# Patient Record
Sex: Female | Born: 1950 | Race: White | Hispanic: No | Marital: Married | Smoking: Never smoker
Health system: Southern US, Community
[De-identification: ages and names within clinical notes are randomized; demographics above are authoritative.]

## PROBLEM LIST (undated history)

## (undated) DIAGNOSIS — I341 Nonrheumatic mitral (valve) prolapse: Secondary | ICD-10-CM

## (undated) DIAGNOSIS — E785 Hyperlipidemia, unspecified: Secondary | ICD-10-CM

## (undated) DIAGNOSIS — C719 Malignant neoplasm of brain, unspecified: Secondary | ICD-10-CM

## (undated) HISTORY — PX: OTHER SURGICAL HISTORY: SHX169

## (undated) HISTORY — PX: TONSILLECTOMY: SUR1361

## (undated) HISTORY — DX: Hyperlipidemia, unspecified: E78.5

## (undated) HISTORY — DX: Nonrheumatic mitral (valve) prolapse: I34.1

---

## 1998-09-30 HISTORY — PX: TOTAL ABDOMINAL HYSTERECTOMY W/ BILATERAL SALPINGOOPHORECTOMY: SHX83

## 1998-09-30 HISTORY — PX: ABDOMINAL HYSTERECTOMY: SHX81

## 2005-01-03 ENCOUNTER — Ambulatory Visit: Payer: Self-pay | Admitting: Unknown Physician Specialty

## 2006-02-13 ENCOUNTER — Ambulatory Visit: Payer: Self-pay | Admitting: Unknown Physician Specialty

## 2007-04-23 ENCOUNTER — Ambulatory Visit: Payer: Self-pay | Admitting: Unknown Physician Specialty

## 2007-04-30 ENCOUNTER — Ambulatory Visit: Payer: Self-pay | Admitting: Unknown Physician Specialty

## 2007-11-09 ENCOUNTER — Ambulatory Visit: Payer: Self-pay | Admitting: Unknown Physician Specialty

## 2008-06-23 ENCOUNTER — Ambulatory Visit: Payer: Self-pay | Admitting: Unknown Physician Specialty

## 2008-07-04 ENCOUNTER — Ambulatory Visit: Payer: Self-pay | Admitting: Unknown Physician Specialty

## 2009-08-30 ENCOUNTER — Ambulatory Visit: Payer: Self-pay | Admitting: Unknown Physician Specialty

## 2010-09-19 ENCOUNTER — Ambulatory Visit: Payer: Self-pay | Admitting: Unknown Physician Specialty

## 2010-09-30 LAB — HM PAP SMEAR

## 2011-10-21 ENCOUNTER — Ambulatory Visit: Payer: Self-pay | Admitting: Unknown Physician Specialty

## 2012-01-02 ENCOUNTER — Encounter: Payer: Self-pay | Admitting: Internal Medicine

## 2012-01-02 ENCOUNTER — Ambulatory Visit (INDEPENDENT_AMBULATORY_CARE_PROVIDER_SITE_OTHER): Payer: BC Managed Care – PPO | Admitting: Internal Medicine

## 2012-01-02 VITALS — BP 120/68 | HR 93 | Temp 98.3°F | Wt 103.0 lb

## 2012-01-02 DIAGNOSIS — E785 Hyperlipidemia, unspecified: Secondary | ICD-10-CM | POA: Insufficient documentation

## 2012-01-02 DIAGNOSIS — J309 Allergic rhinitis, unspecified: Secondary | ICD-10-CM

## 2012-01-02 DIAGNOSIS — J302 Other seasonal allergic rhinitis: Secondary | ICD-10-CM | POA: Insufficient documentation

## 2012-01-02 DIAGNOSIS — Z23 Encounter for immunization: Secondary | ICD-10-CM | POA: Insufficient documentation

## 2012-01-02 DIAGNOSIS — N952 Postmenopausal atrophic vaginitis: Secondary | ICD-10-CM

## 2012-01-02 MED ORDER — CETIRIZINE HCL 10 MG PO TABS: 10.0000 mg | ORAL_TABLET | Freq: Every day | ORAL | Status: DC

## 2012-01-02 MED ORDER — ATORVASTATIN CALCIUM 20 MG PO TABS: 10.0000 mg | ORAL_TABLET | Freq: Every day | ORAL | Status: DC

## 2012-01-02 MED ORDER — ESTROGENS, CONJUGATED 0.625 MG/GM VA CREA: 0.5000 g | TOPICAL_CREAM | VAGINAL | Status: DC

## 2012-01-02 MED ORDER — ZOSTER VACCINE LIVE 19400 UNT/0.65ML ~~LOC~~ SOLR: 0.6500 mL | Freq: Once | SUBCUTANEOUS | Status: AC

## 2012-01-02 NOTE — Assessment & Plan Note (Signed)
Will check lipids and LFTs with labs today. Followup in 6 months for repeat LFTs.

## 2012-01-02 NOTE — Assessment & Plan Note (Signed)
Will continue Premarin. Samples given today.

## 2012-01-02 NOTE — Progress Notes (Signed)
Subjective:    Patient ID: Kelsey Woodard, female    DOB: Feb 11, 1951, 61 y.o.   MRN: [id number]  HPI  61 year old female with history of hyperlipidemia, atrophic vaginitis, and seasonal allergies presents to establish care. She reports that she is doing well. She denies any concerns today. In regards to her hyperlipidemia, she reports full compliance with her Lipitor. She denies any myalgia. In regards to her seasonal allergies, she reports full compliance with her Zyrtec. She reports improvement in nasal drainage and sneezing with this medication. In regards to her history of atrophic vaginitis, she reports improvement in vaginal moisture with minimal use of Premarin. She does not typically use the applicator but applies a small amount externally at night. She reports that she is up-to-date on her health maintenance with the exception of her shingles vaccine.  Outpatient Encounter Prescriptions as of 01/02/2012  Medication Sig Dispense Refill  . atorvastatin (LIPITOR) 20 MG tablet Take 0.5 tablets (10 mg total) by mouth daily.  90 tablet  3  . b complex vitamins tablet Take 1 tablet by mouth daily.      . Calcium Carb-Cholecalciferol 600-800 MG-UNIT TABS Take by mouth daily.      . cetirizine (ZYRTEC) 10 MG tablet Take 1 tablet (10 mg total) by mouth daily.  90 tablet  3  . conjugated estrogens (PREMARIN) vaginal cream Place 0.25 Applicatorfuls vaginally 2 (two) times a week. Or as directed  42.5 g  3  . zoster vaccine live, PF, (ZOSTAVAX) 40981 UNT/0.65ML injection Inject 19,400 Units into the skin once.  1 each  0    BP 120/68  Pulse 93  Temp(Src) 98.3 F (36.8 C) (Oral)  Wt 103 lb (46.72 kg)  SpO2 98%  Review of Systems  Constitutional: Negative for fever, chills, appetite change, fatigue and unexpected weight change.  HENT: Negative for ear pain, congestion, sore throat, trouble swallowing, neck pain, voice change and sinus pressure.   Eyes: Negative for visual disturbance.  Respiratory:  Negative for cough, shortness of breath, wheezing and stridor.   Cardiovascular: Negative for chest pain, palpitations and leg swelling.  Gastrointestinal: Negative for nausea, vomiting, abdominal pain, diarrhea, constipation, blood in stool, abdominal distention and anal bleeding.  Genitourinary: Negative for dysuria and flank pain.  Musculoskeletal: Negative for myalgias, arthralgias and gait problem.  Skin: Negative for color change and rash.  Neurological: Negative for dizziness and headaches.  Hematological: Negative for adenopathy. Does not bruise/bleed easily.  Psychiatric/Behavioral: Negative for suicidal ideas, sleep disturbance and dysphoric mood. The patient is not nervous/anxious.        Objective:   Physical Exam  Constitutional: She is oriented to person, place, and time. She appears well-developed and well-nourished. No distress.  HENT:  Head: Normocephalic and atraumatic.  Right Ear: External ear normal.  Left Ear: External ear normal.  Nose: Nose normal.  Mouth/Throat: Oropharynx is clear and moist. No oropharyngeal exudate.  Eyes: Conjunctivae are normal. Pupils are equal, round, and reactive to light. Right eye exhibits no discharge. Left eye exhibits no discharge. No scleral icterus.  Neck: Normal range of motion. Neck supple. No tracheal deviation present. No thyromegaly present.  Cardiovascular: Normal rate, regular rhythm, normal heart sounds and intact distal pulses.  Exam reveals no gallop and no friction rub.   No murmur heard. Pulmonary/Chest: Effort normal and breath sounds normal. No respiratory distress. She has no wheezes. She has no rales. She exhibits no tenderness.  Abdominal: Soft. Bowel sounds are normal. She exhibits no distension. There  is no tenderness.  Musculoskeletal: Normal range of motion. She exhibits no edema and no tenderness.  Lymphadenopathy:    She has no cervical adenopathy.  Neurological: She is alert and oriented to person, place, and  time. No cranial nerve deficit. She exhibits normal muscle tone. Coordination normal.  Skin: Skin is warm and dry. No rash noted. She is not diaphoretic. No erythema. No pallor.  Psychiatric: She has a normal mood and affect. Her behavior is normal. Judgment and thought content normal.          Assessment & Plan:

## 2012-01-02 NOTE — Assessment & Plan Note (Signed)
Will continue Zyrtec. Followup as needed.

## 2012-01-03 LAB — COMPREHENSIVE METABOLIC PANEL
AST: 28 U/L (ref 0–37)
Alkaline Phosphatase: 56 U/L (ref 39–117)
BUN: 20 mg/dL (ref 6–23)
Glucose, Bld: 97 mg/dL (ref 70–99)
Potassium: 4.4 mEq/L (ref 3.5–5.1)
Sodium: 142 mEq/L (ref 135–145)
Total Bilirubin: 0.3 mg/dL (ref 0.3–1.2)

## 2012-01-03 LAB — LIPID PANEL
Cholesterol: 170 mg/dL (ref 0–200)
LDL Cholesterol: 109 mg/dL — ABNORMAL HIGH (ref 0–99)
Triglycerides: 59 mg/dL (ref 0.0–149.0)

## 2012-06-24 ENCOUNTER — Other Ambulatory Visit: Payer: Self-pay | Admitting: *Deleted

## 2012-06-24 DIAGNOSIS — N952 Postmenopausal atrophic vaginitis: Secondary | ICD-10-CM

## 2012-06-25 MED ORDER — ESTROGENS, CONJUGATED 0.625 MG/GM VA CREA: 0.5000 g | TOPICAL_CREAM | VAGINAL | Status: DC

## 2012-06-25 NOTE — Telephone Encounter (Signed)
Rx for Premarin called to [facility]. Patient advised via message left on machine at home.

## 2012-07-02 ENCOUNTER — Telehealth: Payer: Self-pay | Admitting: Internal Medicine

## 2012-07-02 NOTE — Telephone Encounter (Signed)
Pt has a sinus infection and she is wondering if she could have some Amoxicillin called in ?? And a call back from a nurse.

## 2012-07-02 NOTE — Telephone Encounter (Signed)
Left message on machine at home for patient to return call. 

## 2012-07-07 NOTE — Telephone Encounter (Signed)
Spoke with patient via telephone she stated that she has been out of town but she took OTC Mucinex and she is feeling much better.

## 2012-10-12 ENCOUNTER — Telehealth: Payer: Self-pay | Admitting: Internal Medicine

## 2012-10-12 NOTE — Telephone Encounter (Signed)
Pt is wanting to know if she could switch from Dr. Dan Humphreys to Dr. Lorin Picket. She says some friends of her family see Dr. Lorin Picket she says it is nothing against Dr. Dan Humphreys at all.

## 2012-10-12 NOTE — Telephone Encounter (Signed)
My preference would be not to change md's among ourselves.  We can discuss this - as you had mentioned earlier.  Thanks.

## 2012-10-12 NOTE — Telephone Encounter (Signed)
Duplicate message.  See other message.

## 2012-10-12 NOTE — Telephone Encounter (Signed)
Charlene, This is fine with me. We have generally not allowed changing between providers, and probably need to make a firm office policy on this. However, recently, there have been some cases of patient changing for various reasons.  I am ambivalent.

## 2012-10-15 ENCOUNTER — Telehealth: Payer: Self-pay | Admitting: Internal Medicine

## 2012-10-15 NOTE — Telephone Encounter (Signed)
Spoke with patient via telephone and advised her to call her pharmacy to see which Rxs would fall in the cheaper tier and let us know.  She stated that she will call me back tomorrow.

## 2012-10-15 NOTE — Telephone Encounter (Signed)
The patient has a new insurance plan for her medication and her birth control pill falls into tier 4 not tier 1. She is wanting a birth control pill that is similar to what she is taking and will fall in the tier 1 category.

## 2012-10-26 NOTE — Telephone Encounter (Addendum)
Spoke to patient and was advised that she did not call about her insurance plan or birth control pills. Patient states that she called about switching her appointment from Dr. Dan Humphreys to Dr. Lorin Picket in April.

## 2012-10-27 NOTE — Telephone Encounter (Signed)
Please check on this and let patient know.

## 2012-10-29 ENCOUNTER — Telehealth: Payer: Self-pay | Admitting: Internal Medicine

## 2012-10-29 NOTE — Telephone Encounter (Signed)
See phone note from 10/12/12.  Pt called back stating no one called to her to let her know if she could switch from dr walker to dr Lorin Picket. I explained to her that one of the dr did not want to switch between the dr in practice Pt insisted that i ask again.  She stated her daughter (didn't give daughter name) was a pt of dr walker and when dr walker was on vacation she called to get an appointment pt daughter has shingles on face and was told we didn't have any opening with any provider and was told to go to urgent care. She thought that was unprofessioinal.  She wanted to know if she canceled her appointment with dr walker and then called back to make appointment with dr Lorin Picket.  I explained to her that we would try to make her appointment with her primary dr first that would be dr walker and if dr walker didn't have an opening we would try to get her in with another provider

## 2012-10-29 NOTE — Telephone Encounter (Signed)
Pt called back to see if she could switch

## 2012-10-29 NOTE — Telephone Encounter (Signed)
To my knowledge I have not seen this pt.

## 2012-10-29 NOTE — Telephone Encounter (Signed)
Duplicate message.  To my knowledge I have not seen this pt.

## 2012-10-29 NOTE — Telephone Encounter (Signed)
To my knowledge I have not seen this pt.   

## 2012-10-29 NOTE — Telephone Encounter (Signed)
This pt was seen by Dr. Lin Givens in the past at [facility], [place]. See notes in chart.

## 2012-10-29 NOTE — Telephone Encounter (Signed)
Dr. Lorin Picket, did you previously see this patient?  She would like to know if she can see you in April vs. Dr. Dan Humphreys, she has only seen Dr. Dan Humphreys once as a new patient. Please advise so I can advise patient.  If you haven't seen patient in the past then I will let her know that she needs to stay with Dr. Dan Humphreys since we prefer patients to stay with one provider.

## 2012-10-29 NOTE — Telephone Encounter (Signed)
Sounds like she is unhappy at this office. In general, I think our policy has been not to change in between providers.

## 2012-11-04 ENCOUNTER — Encounter: Payer: Self-pay | Admitting: Internal Medicine

## 2012-11-10 ENCOUNTER — Telehealth: Payer: Self-pay | Admitting: Internal Medicine

## 2012-11-10 NOTE — Telephone Encounter (Signed)
Patient dismissed from [facility] by Jennifer Walker, MD, effective 11/04/2012. Dismissal letter sent out by certified / registered mail. rmf °

## 2012-11-10 NOTE — Telephone Encounter (Signed)
Patient called wanting to switch from Dr. Dan Humphreys to Dr. Lorin Picket . Her chart is showing that she has been discharged from this practice . I did ask if she had received any communication from this office ,not stating that she was discharged . She informed me she had not received any letters or calls from our office . I called Carollee Herter to see what we could inform the patient, and her reply was we can tell her that the doctors did make the choice that Dr. Tilman Neat patient's will stay with Dr. Dan Humphreys and Dr. Roby Lofts patient's will stay with Dr. Lorin Picket. Patient asked was this a policy. I informed her that it was not a policy but a choice made by the doctors. She stated she would go to someone higher up to ask them about our policies. I did offer her the supervisors line to leave a message and she would return her call.

## 2012-12-21 ENCOUNTER — Other Ambulatory Visit: Payer: Self-pay | Admitting: Internal Medicine

## 2013-01-07 ENCOUNTER — Encounter: Payer: BC Managed Care – PPO | Admitting: Internal Medicine

## 2013-01-11 ENCOUNTER — Encounter: Payer: BC Managed Care – PPO | Admitting: Internal Medicine

## 2013-03-23 ENCOUNTER — Ambulatory Visit: Payer: Self-pay | Admitting: Internal Medicine

## 2013-08-05 ENCOUNTER — Other Ambulatory Visit: Payer: Self-pay

## 2014-05-02 ENCOUNTER — Ambulatory Visit: Payer: Self-pay | Admitting: Internal Medicine

## 2015-03-21 ENCOUNTER — Other Ambulatory Visit: Payer: Self-pay | Admitting: Internal Medicine

## 2015-03-21 DIAGNOSIS — Z1231 Encounter for screening mammogram for malignant neoplasm of breast: Secondary | ICD-10-CM

## 2015-05-04 ENCOUNTER — Ambulatory Visit: Payer: Self-pay

## 2015-06-07 ENCOUNTER — Ambulatory Visit
Admission: RE | Admit: 2015-06-07 | Discharge: 2015-06-07 | Disposition: A | Discharge status: Home / Self Care | Payer: PPO | Source: Ambulatory Visit | Attending: Internal Medicine | Admitting: Internal Medicine

## 2015-06-07 DIAGNOSIS — Z1231 Encounter for screening mammogram for malignant neoplasm of breast: Secondary | ICD-10-CM | POA: Diagnosis present

## 2016-03-18 DIAGNOSIS — R7309 Other abnormal glucose: Secondary | ICD-10-CM | POA: Diagnosis not present

## 2016-03-18 DIAGNOSIS — E782 Mixed hyperlipidemia: Secondary | ICD-10-CM | POA: Diagnosis not present

## 2016-03-18 DIAGNOSIS — Z79899 Other long term (current) drug therapy: Secondary | ICD-10-CM | POA: Diagnosis not present

## 2016-03-18 DIAGNOSIS — E7800 Pure hypercholesterolemia, unspecified: Secondary | ICD-10-CM | POA: Diagnosis not present

## 2016-03-18 DIAGNOSIS — M810 Age-related osteoporosis without current pathological fracture: Secondary | ICD-10-CM | POA: Diagnosis not present

## 2016-03-18 DIAGNOSIS — Z0000 Encounter for general adult medical examination without abnormal findings: Secondary | ICD-10-CM | POA: Diagnosis not present

## 2016-03-18 DIAGNOSIS — J301 Allergic rhinitis due to pollen: Secondary | ICD-10-CM | POA: Diagnosis not present

## 2016-03-18 DIAGNOSIS — Z1211 Encounter for screening for malignant neoplasm of colon: Secondary | ICD-10-CM | POA: Diagnosis not present

## 2016-03-18 DIAGNOSIS — Z23 Encounter for immunization: Secondary | ICD-10-CM | POA: Diagnosis not present

## 2016-03-18 DIAGNOSIS — Z1239 Encounter for other screening for malignant neoplasm of breast: Secondary | ICD-10-CM | POA: Diagnosis not present

## 2016-03-27 DIAGNOSIS — Z1211 Encounter for screening for malignant neoplasm of colon: Secondary | ICD-10-CM | POA: Diagnosis not present

## 2016-04-16 ENCOUNTER — Other Ambulatory Visit: Payer: Self-pay | Admitting: Internal Medicine

## 2016-04-16 DIAGNOSIS — Z1231 Encounter for screening mammogram for malignant neoplasm of breast: Secondary | ICD-10-CM

## 2016-06-10 ENCOUNTER — Ambulatory Visit: Payer: PPO

## 2016-06-26 ENCOUNTER — Other Ambulatory Visit: Payer: Self-pay | Admitting: Internal Medicine

## 2016-06-26 ENCOUNTER — Ambulatory Visit
Admission: RE | Admit: 2016-06-26 | Discharge: 2016-06-26 | Disposition: A | Discharge status: Home / Self Care | Payer: PPO | Source: Ambulatory Visit | Attending: Internal Medicine | Admitting: Internal Medicine

## 2016-06-26 DIAGNOSIS — R928 Other abnormal and inconclusive findings on diagnostic imaging of breast: Secondary | ICD-10-CM | POA: Insufficient documentation

## 2016-06-26 DIAGNOSIS — Z1231 Encounter for screening mammogram for malignant neoplasm of breast: Secondary | ICD-10-CM

## 2016-06-27 ENCOUNTER — Other Ambulatory Visit: Payer: Self-pay | Admitting: Internal Medicine

## 2016-06-27 DIAGNOSIS — N6310 Unspecified lump in the right breast, unspecified quadrant: Secondary | ICD-10-CM

## 2016-07-16 ENCOUNTER — Ambulatory Visit
Admission: RE | Admit: 2016-07-16 | Discharge: 2016-07-16 | Disposition: A | Discharge status: Home / Self Care | Payer: PPO | Source: Ambulatory Visit | Attending: Internal Medicine | Admitting: Internal Medicine

## 2016-07-16 DIAGNOSIS — R928 Other abnormal and inconclusive findings on diagnostic imaging of breast: Secondary | ICD-10-CM | POA: Diagnosis not present

## 2016-07-16 DIAGNOSIS — N6310 Unspecified lump in the right breast, unspecified quadrant: Secondary | ICD-10-CM

## 2016-09-17 DIAGNOSIS — H524 Presbyopia: Secondary | ICD-10-CM | POA: Diagnosis not present

## 2016-09-17 DIAGNOSIS — D3131 Benign neoplasm of right choroid: Secondary | ICD-10-CM | POA: Diagnosis not present

## 2016-12-01 ENCOUNTER — Encounter: Payer: Self-pay | Admitting: Emergency Medicine

## 2016-12-01 ENCOUNTER — Emergency Department
Admission: EM | Admit: 2016-12-01 | Discharge: 2016-12-01 | Payer: PPO | Attending: Student in an Organized Health Care Education/Training Program | Admitting: Student in an Organized Health Care Education/Training Program

## 2016-12-01 ENCOUNTER — Emergency Department: Payer: PPO

## 2016-12-01 DIAGNOSIS — Z5181 Encounter for therapeutic drug level monitoring: Secondary | ICD-10-CM | POA: Diagnosis not present

## 2016-12-01 DIAGNOSIS — Z79899 Other long term (current) drug therapy: Secondary | ICD-10-CM | POA: Insufficient documentation

## 2016-12-01 DIAGNOSIS — G9389 Other specified disorders of brain: Secondary | ICD-10-CM

## 2016-12-01 DIAGNOSIS — G939 Disorder of brain, unspecified: Secondary | ICD-10-CM | POA: Diagnosis not present

## 2016-12-01 DIAGNOSIS — Z90710 Acquired absence of both cervix and uterus: Secondary | ICD-10-CM | POA: Diagnosis not present

## 2016-12-01 DIAGNOSIS — M810 Age-related osteoporosis without current pathological fracture: Secondary | ICD-10-CM | POA: Diagnosis not present

## 2016-12-01 DIAGNOSIS — R519 Headache, unspecified: Secondary | ICD-10-CM

## 2016-12-01 DIAGNOSIS — Z794 Long term (current) use of insulin: Secondary | ICD-10-CM | POA: Diagnosis not present

## 2016-12-01 DIAGNOSIS — N809 Endometriosis, unspecified: Secondary | ICD-10-CM | POA: Diagnosis not present

## 2016-12-01 DIAGNOSIS — R4182 Altered mental status, unspecified: Secondary | ICD-10-CM | POA: Diagnosis not present

## 2016-12-01 DIAGNOSIS — C711 Malignant neoplasm of frontal lobe: Secondary | ICD-10-CM | POA: Diagnosis not present

## 2016-12-01 DIAGNOSIS — E785 Hyperlipidemia, unspecified: Secondary | ICD-10-CM | POA: Diagnosis not present

## 2016-12-01 DIAGNOSIS — R9089 Other abnormal findings on diagnostic imaging of central nervous system: Secondary | ICD-10-CM

## 2016-12-01 DIAGNOSIS — R51 Headache: Secondary | ICD-10-CM | POA: Diagnosis not present

## 2016-12-01 DIAGNOSIS — Z8052 Family history of malignant neoplasm of bladder: Secondary | ICD-10-CM | POA: Diagnosis not present

## 2016-12-01 DIAGNOSIS — Z9989 Dependence on other enabling machines and devices: Secondary | ICD-10-CM | POA: Diagnosis not present

## 2016-12-01 DIAGNOSIS — D649 Anemia, unspecified: Secondary | ICD-10-CM | POA: Diagnosis not present

## 2016-12-01 DIAGNOSIS — R930 Abnormal findings on diagnostic imaging of skull and head, not elsewhere classified: Secondary | ICD-10-CM | POA: Diagnosis not present

## 2016-12-01 DIAGNOSIS — R400 Somnolence: Secondary | ICD-10-CM | POA: Diagnosis not present

## 2016-12-01 LAB — CBC
HCT: 38.4 % (ref 35.0–47.0)
Hemoglobin: 13 g/dL (ref 12.0–16.0)
MCH: 28.6 pg (ref 26.0–34.0)
MCHC: 34 g/dL (ref 32.0–36.0)
MCV: 84.2 fL (ref 80.0–100.0)
Platelets: 229 10*3/uL (ref 150–440)
RBC: 4.56 MIL/uL (ref 3.80–5.20)
RDW: 13.8 % (ref 11.5–14.5)
WBC: 8.3 10*3/uL (ref 3.6–11.0)

## 2016-12-01 LAB — COMPREHENSIVE METABOLIC PANEL
ALT: 19 U/L (ref 14–54)
ANION GAP: 9 (ref 5–15)
AST: 23 U/L (ref 15–41)
Albumin: 4.2 g/dL (ref 3.5–5.0)
Alkaline Phosphatase: 65 U/L (ref 38–126)
BUN: 25 mg/dL — ABNORMAL HIGH (ref 6–20)
CHLORIDE: 110 mmol/L (ref 101–111)
CO2: 25 mmol/L (ref 22–32)
Calcium: 9 mg/dL (ref 8.9–10.3)
Creatinine, Ser: 0.87 mg/dL (ref 0.44–1.00)
Glucose, Bld: 100 mg/dL — ABNORMAL HIGH (ref 65–99)
Potassium: 3.9 mmol/L (ref 3.5–5.1)
SODIUM: 144 mmol/L (ref 135–145)
Total Bilirubin: 0.7 mg/dL (ref 0.3–1.2)
Total Protein: 7.8 g/dL (ref 6.5–8.1)

## 2016-12-01 LAB — GLUCOSE, CAPILLARY: Glucose-Capillary: 87 mg/dL (ref 65–99)

## 2016-12-01 MED ORDER — SODIUM CHLORIDE 0.9 % IV SOLN
1000.0000 mg | Freq: Once | INTRAVENOUS | Status: AC
  Administered 2016-12-01: 1000 mg via INTRAVENOUS
  Filled 2016-12-01: qty 10

## 2016-12-01 MED ORDER — DEXAMETHASONE SODIUM PHOSPHATE 10 MG/ML IJ SOLN
10.0000 mg | Freq: Once | INTRAMUSCULAR | Status: AC
  Administered 2016-12-01: 10 mg via INTRAVENOUS
  Filled 2016-12-01: qty 1

## 2016-12-01 MED ORDER — DIPHENHYDRAMINE HCL 50 MG/ML IJ SOLN
25.0000 mg | Freq: Once | INTRAMUSCULAR | Status: AC
  Administered 2016-12-01: 25 mg via INTRAVENOUS
  Filled 2016-12-01: qty 1

## 2016-12-01 MED ORDER — PROCHLORPERAZINE EDISYLATE 5 MG/ML IJ SOLN
10.0000 mg | Freq: Once | INTRAMUSCULAR | Status: AC
  Administered 2016-12-01: 10 mg via INTRAVENOUS
  Filled 2016-12-01: qty 2

## 2016-12-01 MED ORDER — ACETAMINOPHEN 500 MG PO TABS
1000.0000 mg | ORAL_TABLET | Freq: Once | ORAL | Status: AC
  Administered 2016-12-01: 1000 mg via ORAL
  Filled 2016-12-01: qty 2

## 2016-12-01 MED ORDER — IOPAMIDOL (ISOVUE-370) INJECTION 76%
75.0000 mL | Freq: Once | INTRAVENOUS | Status: AC | PRN
  Administered 2016-12-01: 75 mL via INTRAVENOUS

## 2016-12-01 NOTE — ED Notes (Signed)
MD Robinson at bedside 

## 2016-12-01 NOTE — ED Triage Notes (Signed)
Headaches off and on and periods of confusion since Friday.  Unable to comprehend simple things per husband. Pt has been taking care of aunt's finances and while doing checkbook but was not able to grasp idea of what she was doing. Pt is alert and oriented to person, place, time and situation.

## 2016-12-01 NOTE — ED Provider Notes (Signed)
[facility] Emergency Department Provider Note    First MD Initiated Contact with Patient 12/01/16 [phone]     (approximate)  I have reviewed the triage vital signs and the nursing notes.   HISTORY  Chief Complaint Headache and Altered Mental Status    HPI Kelsey Woodard is a 66 y.o. female who presents with left-sided headache that is been on and off since Friday.  According to husband patient's been more confused than usual. The patient denies any history of headaches. States that the headache is been left posterior neck.  Denies any numbness or tingling. No unsteady gait. No trauma. No fevers. No visual disturbance.  According to patient and husband she's having more difficulty performing simple tasks. The primary concern is this confusion which is new.   Past Medical History:  Diagnosis Date  . Endometriosis   . Hyperlipidemia   . Mitral valve prolapse    Family History  Problem Relation Age of Onset  . Cancer Father     Bladder - 28's  . Hyperlipidemia Father   . Heart disease Father   . Cancer Maternal Aunt     Breast - 70's  . Breast cancer Maternal Aunt 82   Past Surgical History:  Procedure Laterality Date  . ABDOMINAL HYSTERECTOMY  2000   Dr. Rayford Halsted  . CESAREAN SECTION     1  . TONSILLECTOMY    . TOTAL ABDOMINAL HYSTERECTOMY W/ BILATERAL SALPINGOOPHORECTOMY  2000   Patient Active Problem List   Diagnosis Date Noted  . Hyperlipidemia LDL goal < 100 01/02/2012  . Seasonal allergies 01/02/2012  . Atrophic vaginitis 01/02/2012  . Vaccine for VZV (varicella-zoster virus) 01/02/2012      Prior to Admission medications   Medication Sig Start Date End Date Taking? Authorizing Provider  atorvastatin (LIPITOR) 20 MG tablet Take 0.5 tablets (10 mg total) by mouth daily. 01/02/12   Jackolyn Confer, MD  b complex vitamins tablet Take 1 tablet by mouth daily.    Historical Provider, MD  Calcium Carb-Cholecalciferol 600-800 MG-UNIT TABS Take  by mouth daily.    Historical Provider, MD  cetirizine (ZYRTEC) 10 MG tablet TAKE 1 TABLET (10 MG TOTAL) BY MOUTH DAILY. 12/21/12   Jackolyn Confer, MD  conjugated estrogens (PREMARIN) vaginal cream Place [id number] Applicatorfuls vaginally 2 (two) times a week. Or as directed 06/24/12   Jackolyn Confer, MD    Allergies Patient has no known allergies.    Social History Social History  Substance Use Topics  . Smoking status: Never Smoker  . Smokeless tobacco: Never Used  . Alcohol use No    Review of Systems Patient denies headaches, rhinorrhea, blurry vision, numbness, shortness of breath, chest pain, edema, cough, abdominal pain, nausea, vomiting, diarrhea, dysuria, fevers, rashes or hallucinations unless otherwise stated above in HPI. ____________________________________________   PHYSICAL EXAM:  VITAL SIGNS: Vitals:   12/01/16 0909  Temp: 97.8 F (36.6 C)    Constitutional: Alert and oriented. Well appearing and in no acute distress. Eyes: Conjunctivae are normal. PERRL. EOMI. Head: Atraumatic. Nose: No congestion/rhinnorhea. Mouth/Throat: Mucous membranes are moist.  Oropharynx non-erythematous. Neck: No stridor. Painless ROM. No cervical spine tenderness to palpation Hematological/Lymphatic/Immunilogical: No cervical lymphadenopathy. Cardiovascular: Normal rate, regular rhythm. Grossly normal heart sounds.  Good peripheral circulation. Respiratory: Normal respiratory effort.  No retractions. Lungs CTAB. Gastrointestinal: Soft and nontender. No distention. No abdominal bruits. No CVA tenderness. Musculoskeletal: No lower extremity tenderness nor edema.  No joint effusions. Neurologic:  CN- intact.  No facial droop, Normal FNF.  Normal heel to shin.  Sensation intact bilaterally. Normal speech and language. No gross focal neurologic deficits are appreciated. No gait instability.  Skin:  Skin is warm, dry and intact. No rash noted. Psychiatric: Mood and affect are normal.  Speech and behavior are normal.  ____________________________________________   LABS (all labs ordered are listed, but only abnormal results are displayed)  Results for orders placed or performed during the hospital encounter of 12/01/16 (from the past 24 hour(s))  Comprehensive metabolic panel     Status: Abnormal   Collection Time: 12/01/16  9:19 AM  Result Value Ref Range   Sodium 144 135 - 145 mmol/L   Potassium 3.9 3.5 - 5.1 mmol/L   Chloride 110 101 - 111 mmol/L   CO2 25 22 - 32 mmol/L   Glucose, Bld 100 (H) 65 - 99 mg/dL   BUN 25 (H) 6 - 20 mg/dL   Creatinine, Ser 0.87 0.44 - 1.00 mg/dL   Calcium 9.0 8.9 - 10.3 mg/dL   Total Protein 7.8 6.5 - 8.1 g/dL   Albumin 4.2 3.5 - 5.0 g/dL   AST 23 15 - 41 U/L   ALT 19 14 - 54 U/L   Alkaline Phosphatase 65 38 - 126 U/L   Total Bilirubin 0.7 0.3 - 1.2 mg/dL   GFR calc non Af Amer >60 >60 mL/min   GFR calc Af Amer >60 >60 mL/min   Anion gap 9 5 - 15  CBC     Status: None   Collection Time: 12/01/16  9:19 AM  Result Value Ref Range   WBC 8.3 3.6 - 11.0 K/uL   RBC 4.56 3.80 - 5.20 MIL/uL   Hemoglobin 13.0 12.0 - 16.0 g/dL   HCT 38.4 35.0 - 47.0 %   MCV 84.2 80.0 - 100.0 fL   MCH 28.6 26.0 - 34.0 pg   MCHC 34.0 32.0 - 36.0 g/dL   RDW 13.8 11.5 - 14.5 %   Platelets 229 150 - 440 K/uL  Glucose, capillary     Status: None   Collection Time: 12/01/16  9:43 AM  Result Value Ref Range   Glucose-Capillary 87 65 - 99 mg/dL   ____________________________________________  EKG My review and personal interpretation at Time: 9:14   Indication: confusion  Rate: 75  Rhythm: sinus Axis: normal Other: normal intervals, noSTEMI ____________________________________________  RADIOLOGY  I personally reviewed all radiographic images ordered to evaluate for the above acute complaints and reviewed radiology reports and findings.  These findings were personally discussed with the patient.  Please see medical record for radiology  report. _______________________________________   PROCEDURES  Procedure(s) performed:  Procedures    Critical Care performed: yes CRITICAL CARE Performed by: Merlyn Lot   Total critical care time: 40 minutes  Critical care time was exclusive of separately billable procedures and treating other patients.  Critical care was necessary to treat or prevent imminent or life-threatening deterioration.  Critical care was time spent personally by me on the following activities: development of treatment plan with patient and/or surrogate as well as nursing, discussions with consultants, evaluation of patient's response to treatment, examination of patient, obtaining history from patient or surrogate, ordering and performing treatments and interventions, ordering and review of laboratory studies, ordering and review of radiographic studies, pulse oximetry and re-evaluation of patient's condition.   _____________________   INITIAL IMPRESSION / ASSESSMENT AND PLAN / ED COURSE  Pertinent labs & imaging results that were available  during my care of the patient were reviewed by me and considered in my medical decision making (see chart for details).  DDX: tension, migraine, cluster, dissection, cervical strain, mastoiditis, mass  Kelsey Woodard is a 66 y.o. who presents to the ED with headache and confusion with neck pain. No history of headache headaches previously. No trauma. Given her age and neck pain CT angiogram of the head and neck ordered to evaluate for any evidence of dissection or mass. Less consistent with acute infectious process. Neuro exam as above. 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 I will start the patient on IV Keppra as well as IV Decadron. The patient's headache improved with migraine cocktail. I spoke to neurosurgery, Dr. Army Melia, at [facility] who is except the patient  for evaluation. Patient remains hemodynamically stable.  Have discussed with the patient and available family all diagnostics and treatments performed thus far and all questions were answered to the best of my ability. The patient demonstrates understanding and agreement with plan.       ____________________________________________   FINAL CLINICAL IMPRESSION(S) / ED DIAGNOSES  Final diagnoses:  Acute nonintractable headache, unspecified headache type  Mass, brain  Abnormal brain CT      NEW MEDICATIONS STARTED DURING THIS VISIT:  New Prescriptions   No medications on file     Note:  This document was prepared using Dragon voice recognition software and may include unintentional dictation errors.    Merlyn Lot, MD 12/01/16 1125

## 2016-12-01 NOTE — ED Notes (Signed)
Patient transported to CT 

## 2016-12-02 DIAGNOSIS — Z881 Allergy status to other antibiotic agents status: Secondary | ICD-10-CM | POA: Diagnosis not present

## 2016-12-02 DIAGNOSIS — Z794 Long term (current) use of insulin: Secondary | ICD-10-CM | POA: Diagnosis not present

## 2016-12-02 DIAGNOSIS — Z882 Allergy status to sulfonamides status: Secondary | ICD-10-CM | POA: Diagnosis not present

## 2016-12-02 DIAGNOSIS — M2578 Osteophyte, vertebrae: Secondary | ICD-10-CM | POA: Diagnosis not present

## 2016-12-02 DIAGNOSIS — R4701 Aphasia: Secondary | ICD-10-CM | POA: Diagnosis not present

## 2016-12-02 DIAGNOSIS — Z90710 Acquired absence of both cervix and uterus: Secondary | ICD-10-CM | POA: Diagnosis not present

## 2016-12-02 DIAGNOSIS — D496 Neoplasm of unspecified behavior of brain: Secondary | ICD-10-CM | POA: Diagnosis not present

## 2016-12-02 DIAGNOSIS — Z01818 Encounter for other preprocedural examination: Secondary | ICD-10-CM | POA: Diagnosis not present

## 2016-12-02 DIAGNOSIS — R400 Somnolence: Secondary | ICD-10-CM | POA: Diagnosis not present

## 2016-12-02 DIAGNOSIS — Z8052 Family history of malignant neoplasm of bladder: Secondary | ICD-10-CM | POA: Diagnosis not present

## 2016-12-02 DIAGNOSIS — N809 Endometriosis, unspecified: Secondary | ICD-10-CM | POA: Diagnosis not present

## 2016-12-02 DIAGNOSIS — D649 Anemia, unspecified: Secondary | ICD-10-CM | POA: Diagnosis not present

## 2016-12-02 DIAGNOSIS — Z79899 Other long term (current) drug therapy: Secondary | ICD-10-CM | POA: Diagnosis not present

## 2016-12-02 DIAGNOSIS — Z809 Family history of malignant neoplasm, unspecified: Secondary | ICD-10-CM | POA: Diagnosis not present

## 2016-12-02 DIAGNOSIS — G9389 Other specified disorders of brain: Secondary | ICD-10-CM | POA: Diagnosis not present

## 2016-12-02 DIAGNOSIS — M4802 Spinal stenosis, cervical region: Secondary | ICD-10-CM | POA: Diagnosis not present

## 2016-12-02 DIAGNOSIS — Z9889 Other specified postprocedural states: Secondary | ICD-10-CM | POA: Diagnosis not present

## 2016-12-02 DIAGNOSIS — G939 Disorder of brain, unspecified: Secondary | ICD-10-CM | POA: Diagnosis not present

## 2016-12-02 DIAGNOSIS — E785 Hyperlipidemia, unspecified: Secondary | ICD-10-CM | POA: Diagnosis not present

## 2016-12-02 DIAGNOSIS — M810 Age-related osteoporosis without current pathological fracture: Secondary | ICD-10-CM | POA: Diagnosis not present

## 2016-12-02 DIAGNOSIS — Z5181 Encounter for therapeutic drug level monitoring: Secondary | ICD-10-CM | POA: Diagnosis not present

## 2016-12-02 DIAGNOSIS — G936 Cerebral edema: Secondary | ICD-10-CM | POA: Diagnosis not present

## 2016-12-02 DIAGNOSIS — C711 Malignant neoplasm of frontal lobe: Secondary | ICD-10-CM | POA: Diagnosis not present

## 2016-12-02 DIAGNOSIS — Z8249 Family history of ischemic heart disease and other diseases of the circulatory system: Secondary | ICD-10-CM | POA: Diagnosis not present

## 2016-12-03 DIAGNOSIS — D496 Neoplasm of unspecified behavior of brain: Secondary | ICD-10-CM | POA: Diagnosis not present

## 2016-12-04 DIAGNOSIS — Z9889 Other specified postprocedural states: Secondary | ICD-10-CM | POA: Diagnosis not present

## 2016-12-04 DIAGNOSIS — D496 Neoplasm of unspecified behavior of brain: Secondary | ICD-10-CM | POA: Diagnosis not present

## 2016-12-05 DIAGNOSIS — D496 Neoplasm of unspecified behavior of brain: Secondary | ICD-10-CM | POA: Diagnosis not present

## 2016-12-05 DIAGNOSIS — Z9889 Other specified postprocedural states: Secondary | ICD-10-CM | POA: Diagnosis not present

## 2016-12-05 DIAGNOSIS — C711 Malignant neoplasm of frontal lobe: Secondary | ICD-10-CM | POA: Diagnosis not present

## 2016-12-06 DIAGNOSIS — D496 Neoplasm of unspecified behavior of brain: Secondary | ICD-10-CM | POA: Diagnosis not present

## 2016-12-07 DIAGNOSIS — D496 Neoplasm of unspecified behavior of brain: Secondary | ICD-10-CM | POA: Diagnosis not present

## 2016-12-17 DIAGNOSIS — C711 Malignant neoplasm of frontal lobe: Secondary | ICD-10-CM | POA: Diagnosis not present

## 2016-12-20 DIAGNOSIS — C711 Malignant neoplasm of frontal lobe: Secondary | ICD-10-CM | POA: Diagnosis not present

## 2016-12-30 DIAGNOSIS — C711 Malignant neoplasm of frontal lobe: Secondary | ICD-10-CM | POA: Diagnosis not present

## 2016-12-30 DIAGNOSIS — C719 Malignant neoplasm of brain, unspecified: Secondary | ICD-10-CM | POA: Diagnosis not present

## 2017-01-01 DIAGNOSIS — C719 Malignant neoplasm of brain, unspecified: Secondary | ICD-10-CM | POA: Diagnosis not present

## 2017-01-01 DIAGNOSIS — Z510 Encounter for antineoplastic radiation therapy: Secondary | ICD-10-CM | POA: Diagnosis not present

## 2017-01-01 DIAGNOSIS — C711 Malignant neoplasm of frontal lobe: Secondary | ICD-10-CM | POA: Diagnosis not present

## 2017-01-02 DIAGNOSIS — C711 Malignant neoplasm of frontal lobe: Secondary | ICD-10-CM | POA: Diagnosis not present

## 2017-01-03 DIAGNOSIS — C711 Malignant neoplasm of frontal lobe: Secondary | ICD-10-CM | POA: Diagnosis not present

## 2017-01-06 DIAGNOSIS — C711 Malignant neoplasm of frontal lobe: Secondary | ICD-10-CM | POA: Diagnosis not present

## 2017-01-07 DIAGNOSIS — C711 Malignant neoplasm of frontal lobe: Secondary | ICD-10-CM | POA: Diagnosis not present

## 2017-01-08 DIAGNOSIS — C711 Malignant neoplasm of frontal lobe: Secondary | ICD-10-CM | POA: Diagnosis not present

## 2017-01-08 DIAGNOSIS — C719 Malignant neoplasm of brain, unspecified: Secondary | ICD-10-CM | POA: Diagnosis not present

## 2017-01-09 DIAGNOSIS — C711 Malignant neoplasm of frontal lobe: Secondary | ICD-10-CM | POA: Diagnosis not present

## 2017-01-10 DIAGNOSIS — C711 Malignant neoplasm of frontal lobe: Secondary | ICD-10-CM | POA: Diagnosis not present

## 2017-01-13 DIAGNOSIS — C711 Malignant neoplasm of frontal lobe: Secondary | ICD-10-CM | POA: Diagnosis not present

## 2017-01-14 DIAGNOSIS — C711 Malignant neoplasm of frontal lobe: Secondary | ICD-10-CM | POA: Diagnosis not present

## 2017-01-15 DIAGNOSIS — C719 Malignant neoplasm of brain, unspecified: Secondary | ICD-10-CM | POA: Diagnosis not present

## 2017-01-15 DIAGNOSIS — C711 Malignant neoplasm of frontal lobe: Secondary | ICD-10-CM | POA: Diagnosis not present

## 2017-01-16 DIAGNOSIS — C711 Malignant neoplasm of frontal lobe: Secondary | ICD-10-CM | POA: Diagnosis not present

## 2017-01-17 DIAGNOSIS — C711 Malignant neoplasm of frontal lobe: Secondary | ICD-10-CM | POA: Diagnosis not present

## 2017-01-20 DIAGNOSIS — C711 Malignant neoplasm of frontal lobe: Secondary | ICD-10-CM | POA: Diagnosis not present

## 2017-01-21 DIAGNOSIS — C711 Malignant neoplasm of frontal lobe: Secondary | ICD-10-CM | POA: Diagnosis not present

## 2017-01-22 DIAGNOSIS — C719 Malignant neoplasm of brain, unspecified: Secondary | ICD-10-CM | POA: Diagnosis not present

## 2017-01-22 DIAGNOSIS — C711 Malignant neoplasm of frontal lobe: Secondary | ICD-10-CM | POA: Diagnosis not present

## 2017-01-23 DIAGNOSIS — C711 Malignant neoplasm of frontal lobe: Secondary | ICD-10-CM | POA: Diagnosis not present

## 2017-01-24 DIAGNOSIS — C711 Malignant neoplasm of frontal lobe: Secondary | ICD-10-CM | POA: Diagnosis not present

## 2017-01-27 DIAGNOSIS — C711 Malignant neoplasm of frontal lobe: Secondary | ICD-10-CM | POA: Diagnosis not present

## 2017-01-28 DIAGNOSIS — C719 Malignant neoplasm of brain, unspecified: Secondary | ICD-10-CM | POA: Diagnosis not present

## 2017-01-28 DIAGNOSIS — C711 Malignant neoplasm of frontal lobe: Secondary | ICD-10-CM | POA: Diagnosis not present

## 2017-01-28 DIAGNOSIS — Z510 Encounter for antineoplastic radiation therapy: Secondary | ICD-10-CM | POA: Diagnosis not present

## 2017-01-29 DIAGNOSIS — C711 Malignant neoplasm of frontal lobe: Secondary | ICD-10-CM | POA: Diagnosis not present

## 2017-01-29 DIAGNOSIS — C719 Malignant neoplasm of brain, unspecified: Secondary | ICD-10-CM | POA: Diagnosis not present

## 2017-01-30 DIAGNOSIS — C711 Malignant neoplasm of frontal lobe: Secondary | ICD-10-CM | POA: Diagnosis not present

## 2017-01-31 DIAGNOSIS — C711 Malignant neoplasm of frontal lobe: Secondary | ICD-10-CM | POA: Diagnosis not present

## 2017-01-31 DIAGNOSIS — C719 Malignant neoplasm of brain, unspecified: Secondary | ICD-10-CM | POA: Diagnosis not present

## 2017-02-03 DIAGNOSIS — C711 Malignant neoplasm of frontal lobe: Secondary | ICD-10-CM | POA: Diagnosis not present

## 2017-02-04 DIAGNOSIS — C711 Malignant neoplasm of frontal lobe: Secondary | ICD-10-CM | POA: Diagnosis not present

## 2017-02-05 DIAGNOSIS — C719 Malignant neoplasm of brain, unspecified: Secondary | ICD-10-CM | POA: Diagnosis not present

## 2017-02-05 DIAGNOSIS — C711 Malignant neoplasm of frontal lobe: Secondary | ICD-10-CM | POA: Diagnosis not present

## 2017-02-06 DIAGNOSIS — C711 Malignant neoplasm of frontal lobe: Secondary | ICD-10-CM | POA: Diagnosis not present

## 2017-02-07 DIAGNOSIS — C711 Malignant neoplasm of frontal lobe: Secondary | ICD-10-CM | POA: Diagnosis not present

## 2017-02-10 DIAGNOSIS — C711 Malignant neoplasm of frontal lobe: Secondary | ICD-10-CM | POA: Diagnosis not present

## 2017-02-11 DIAGNOSIS — C711 Malignant neoplasm of frontal lobe: Secondary | ICD-10-CM | POA: Diagnosis not present

## 2017-02-12 DIAGNOSIS — C711 Malignant neoplasm of frontal lobe: Secondary | ICD-10-CM | POA: Diagnosis not present

## 2017-02-12 DIAGNOSIS — C719 Malignant neoplasm of brain, unspecified: Secondary | ICD-10-CM | POA: Diagnosis not present

## 2017-02-13 DIAGNOSIS — C711 Malignant neoplasm of frontal lobe: Secondary | ICD-10-CM | POA: Diagnosis not present

## 2017-02-14 DIAGNOSIS — C711 Malignant neoplasm of frontal lobe: Secondary | ICD-10-CM | POA: Diagnosis not present

## 2017-02-27 DIAGNOSIS — C719 Malignant neoplasm of brain, unspecified: Secondary | ICD-10-CM | POA: Diagnosis not present

## 2017-02-27 DIAGNOSIS — C711 Malignant neoplasm of frontal lobe: Secondary | ICD-10-CM | POA: Diagnosis not present

## 2017-03-24 DIAGNOSIS — Z0000 Encounter for general adult medical examination without abnormal findings: Secondary | ICD-10-CM | POA: Diagnosis not present

## 2017-03-24 DIAGNOSIS — E7800 Pure hypercholesterolemia, unspecified: Secondary | ICD-10-CM | POA: Diagnosis not present

## 2017-03-24 DIAGNOSIS — D508 Other iron deficiency anemias: Secondary | ICD-10-CM | POA: Diagnosis not present

## 2017-03-24 DIAGNOSIS — Z1211 Encounter for screening for malignant neoplasm of colon: Secondary | ICD-10-CM | POA: Diagnosis not present

## 2017-03-24 DIAGNOSIS — Z1231 Encounter for screening mammogram for malignant neoplasm of breast: Secondary | ICD-10-CM | POA: Diagnosis not present

## 2017-03-24 DIAGNOSIS — C719 Malignant neoplasm of brain, unspecified: Secondary | ICD-10-CM | POA: Diagnosis not present

## 2017-03-24 DIAGNOSIS — D496 Neoplasm of unspecified behavior of brain: Secondary | ICD-10-CM | POA: Diagnosis not present

## 2017-03-24 DIAGNOSIS — E782 Mixed hyperlipidemia: Secondary | ICD-10-CM | POA: Diagnosis not present

## 2017-04-04 DIAGNOSIS — C719 Malignant neoplasm of brain, unspecified: Secondary | ICD-10-CM | POA: Diagnosis not present

## 2017-04-04 DIAGNOSIS — R630 Anorexia: Secondary | ICD-10-CM | POA: Diagnosis not present

## 2017-04-04 DIAGNOSIS — C711 Malignant neoplasm of frontal lobe: Secondary | ICD-10-CM | POA: Diagnosis not present

## 2017-04-04 DIAGNOSIS — Z1211 Encounter for screening for malignant neoplasm of colon: Secondary | ICD-10-CM | POA: Diagnosis not present

## 2017-05-01 DIAGNOSIS — G939 Disorder of brain, unspecified: Secondary | ICD-10-CM | POA: Diagnosis not present

## 2017-05-01 DIAGNOSIS — Z9889 Other specified postprocedural states: Secondary | ICD-10-CM | POA: Diagnosis not present

## 2017-05-01 DIAGNOSIS — Z85841 Personal history of malignant neoplasm of brain: Secondary | ICD-10-CM | POA: Diagnosis not present

## 2017-05-01 DIAGNOSIS — C711 Malignant neoplasm of frontal lobe: Secondary | ICD-10-CM | POA: Diagnosis not present

## 2017-05-15 DIAGNOSIS — C719 Malignant neoplasm of brain, unspecified: Secondary | ICD-10-CM | POA: Diagnosis not present

## 2017-05-22 DIAGNOSIS — C719 Malignant neoplasm of brain, unspecified: Secondary | ICD-10-CM | POA: Diagnosis not present

## 2017-05-29 DIAGNOSIS — C719 Malignant neoplasm of brain, unspecified: Secondary | ICD-10-CM | POA: Diagnosis not present

## 2017-06-05 DIAGNOSIS — C719 Malignant neoplasm of brain, unspecified: Secondary | ICD-10-CM | POA: Diagnosis not present

## 2017-06-08 DIAGNOSIS — C719 Malignant neoplasm of brain, unspecified: Secondary | ICD-10-CM | POA: Diagnosis not present

## 2017-06-18 DIAGNOSIS — C719 Malignant neoplasm of brain, unspecified: Secondary | ICD-10-CM | POA: Diagnosis not present

## 2017-06-18 DIAGNOSIS — C711 Malignant neoplasm of frontal lobe: Secondary | ICD-10-CM | POA: Diagnosis not present

## 2017-06-20 DIAGNOSIS — C719 Malignant neoplasm of brain, unspecified: Secondary | ICD-10-CM | POA: Diagnosis not present

## 2017-06-23 DIAGNOSIS — C719 Malignant neoplasm of brain, unspecified: Secondary | ICD-10-CM | POA: Diagnosis not present

## 2017-06-25 DIAGNOSIS — C711 Malignant neoplasm of frontal lobe: Secondary | ICD-10-CM | POA: Diagnosis not present

## 2017-06-25 DIAGNOSIS — C719 Malignant neoplasm of brain, unspecified: Secondary | ICD-10-CM | POA: Diagnosis not present

## 2017-06-30 DIAGNOSIS — C711 Malignant neoplasm of frontal lobe: Secondary | ICD-10-CM | POA: Diagnosis not present

## 2017-06-30 DIAGNOSIS — Z923 Personal history of irradiation: Secondary | ICD-10-CM | POA: Diagnosis not present

## 2017-06-30 DIAGNOSIS — Z5112 Encounter for antineoplastic immunotherapy: Secondary | ICD-10-CM | POA: Diagnosis not present

## 2017-07-03 DIAGNOSIS — C719 Malignant neoplasm of brain, unspecified: Secondary | ICD-10-CM | POA: Diagnosis not present

## 2017-07-10 DIAGNOSIS — C711 Malignant neoplasm of frontal lobe: Secondary | ICD-10-CM | POA: Diagnosis not present

## 2017-07-10 DIAGNOSIS — Z23 Encounter for immunization: Secondary | ICD-10-CM | POA: Diagnosis not present

## 2017-07-10 DIAGNOSIS — Z5112 Encounter for antineoplastic immunotherapy: Secondary | ICD-10-CM | POA: Diagnosis not present

## 2017-07-17 DIAGNOSIS — C719 Malignant neoplasm of brain, unspecified: Secondary | ICD-10-CM | POA: Diagnosis not present

## 2017-07-20 ENCOUNTER — Emergency Department: Payer: PPO

## 2017-07-20 ENCOUNTER — Inpatient Hospital Stay: Payer: PPO

## 2017-07-20 ENCOUNTER — Inpatient Hospital Stay: Payer: PPO | Admitting: Certified Registered"

## 2017-07-20 ENCOUNTER — Encounter: Payer: Self-pay | Admitting: Emergency Medicine

## 2017-07-20 ENCOUNTER — Inpatient Hospital Stay
Admission: EM | Admit: 2017-07-20 | Discharge: 2017-07-23 | DRG: 481 | Disposition: A | Discharge status: Home Health | Payer: PPO | Attending: Internal Medicine | Admitting: Internal Medicine

## 2017-07-20 ENCOUNTER — Encounter
Admission: EM | Disposition: A | Discharge status: Home Health | Payer: Self-pay | Source: Home / Self Care | Attending: Internal Medicine

## 2017-07-20 DIAGNOSIS — S72002A Fracture of unspecified part of neck of left femur, initial encounter for closed fracture: Secondary | ICD-10-CM | POA: Diagnosis not present

## 2017-07-20 DIAGNOSIS — Z9079 Acquired absence of other genital organ(s): Secondary | ICD-10-CM | POA: Diagnosis not present

## 2017-07-20 DIAGNOSIS — I341 Nonrheumatic mitral (valve) prolapse: Secondary | ICD-10-CM | POA: Diagnosis not present

## 2017-07-20 DIAGNOSIS — Y92009 Unspecified place in unspecified non-institutional (private) residence as the place of occurrence of the external cause: Secondary | ICD-10-CM

## 2017-07-20 DIAGNOSIS — R531 Weakness: Secondary | ICD-10-CM | POA: Diagnosis not present

## 2017-07-20 DIAGNOSIS — E785 Hyperlipidemia, unspecified: Secondary | ICD-10-CM | POA: Diagnosis present

## 2017-07-20 DIAGNOSIS — S72012A Unspecified intracapsular fracture of left femur, initial encounter for closed fracture: Secondary | ICD-10-CM | POA: Diagnosis not present

## 2017-07-20 DIAGNOSIS — M810 Age-related osteoporosis without current pathological fracture: Secondary | ICD-10-CM | POA: Diagnosis present

## 2017-07-20 DIAGNOSIS — D696 Thrombocytopenia, unspecified: Secondary | ICD-10-CM | POA: Diagnosis not present

## 2017-07-20 DIAGNOSIS — Z90722 Acquired absence of ovaries, bilateral: Secondary | ICD-10-CM | POA: Diagnosis not present

## 2017-07-20 DIAGNOSIS — M25552 Pain in left hip: Secondary | ICD-10-CM | POA: Diagnosis not present

## 2017-07-20 DIAGNOSIS — Z8249 Family history of ischemic heart disease and other diseases of the circulatory system: Secondary | ICD-10-CM | POA: Diagnosis not present

## 2017-07-20 DIAGNOSIS — Z87898 Personal history of other specified conditions: Secondary | ICD-10-CM | POA: Diagnosis not present

## 2017-07-20 DIAGNOSIS — N809 Endometriosis, unspecified: Secondary | ICD-10-CM | POA: Diagnosis present

## 2017-07-20 DIAGNOSIS — Z90710 Acquired absence of both cervix and uterus: Secondary | ICD-10-CM | POA: Diagnosis not present

## 2017-07-20 DIAGNOSIS — S79912A Unspecified injury of left hip, initial encounter: Secondary | ICD-10-CM | POA: Diagnosis not present

## 2017-07-20 DIAGNOSIS — W1830XA Fall on same level, unspecified, initial encounter: Secondary | ICD-10-CM | POA: Diagnosis present

## 2017-07-20 DIAGNOSIS — Z923 Personal history of irradiation: Secondary | ICD-10-CM | POA: Diagnosis not present

## 2017-07-20 DIAGNOSIS — S72142A Displaced intertrochanteric fracture of left femur, initial encounter for closed fracture: Secondary | ICD-10-CM | POA: Diagnosis not present

## 2017-07-20 DIAGNOSIS — Z8052 Family history of malignant neoplasm of bladder: Secondary | ICD-10-CM | POA: Diagnosis not present

## 2017-07-20 DIAGNOSIS — M79605 Pain in left leg: Secondary | ICD-10-CM | POA: Diagnosis present

## 2017-07-20 DIAGNOSIS — C719 Malignant neoplasm of brain, unspecified: Secondary | ICD-10-CM | POA: Diagnosis present

## 2017-07-20 DIAGNOSIS — Z803 Family history of malignant neoplasm of breast: Secondary | ICD-10-CM

## 2017-07-20 DIAGNOSIS — R569 Unspecified convulsions: Secondary | ICD-10-CM | POA: Diagnosis not present

## 2017-07-20 DIAGNOSIS — Z79899 Other long term (current) drug therapy: Secondary | ICD-10-CM | POA: Diagnosis not present

## 2017-07-20 DIAGNOSIS — E876 Hypokalemia: Secondary | ICD-10-CM | POA: Diagnosis not present

## 2017-07-20 DIAGNOSIS — D62 Acute posthemorrhagic anemia: Secondary | ICD-10-CM | POA: Diagnosis not present

## 2017-07-20 DIAGNOSIS — Z01810 Encounter for preprocedural cardiovascular examination: Secondary | ICD-10-CM | POA: Diagnosis not present

## 2017-07-20 HISTORY — PX: HIP PINNING,CANNULATED: SHX1758

## 2017-07-20 HISTORY — DX: Malignant neoplasm of brain, unspecified: C71.9

## 2017-07-20 LAB — BASIC METABOLIC PANEL
ANION GAP: 4 — AB (ref 5–15)
BUN: 23 mg/dL — ABNORMAL HIGH (ref 6–20)
CHLORIDE: 107 mmol/L (ref 101–111)
CO2: 24 mmol/L (ref 22–32)
Calcium: 9.5 mg/dL (ref 8.9–10.3)
Creatinine, Ser: 0.89 mg/dL (ref 0.44–1.00)
GFR calc Af Amer: >60 mL/min (ref >60)
GFR calc non Af Amer: >60 mL/min (ref >60)
Glucose, Bld: 144 mg/dL — ABNORMAL HIGH (ref 65–99)
POTASSIUM: 3.6 mmol/L (ref 3.5–5.1)
Sodium: 135 mmol/L (ref 135–145)

## 2017-07-20 LAB — ABO/RH: ABO/RH(D): A POS

## 2017-07-20 LAB — URINALYSIS, COMPLETE (UACMP) WITH MICROSCOPIC
Bacteria, UA: NONE SEEN
Bilirubin Urine: NEGATIVE
GLUCOSE, UA: 150 mg/dL — AB
KETONES UR: NEGATIVE mg/dL
Leukocytes, UA: NEGATIVE
Nitrite: NEGATIVE
PH: 5 (ref 5.0–8.0)
Protein, ur: NEGATIVE mg/dL
SPECIFIC GRAVITY, URINE: 1.019 (ref 1.005–1.030)
Squamous Epithelial / LPF: NONE SEEN

## 2017-07-20 LAB — CBC WITH DIFFERENTIAL/PLATELET
BASOS ABS: 0 10*3/uL (ref 0–0.1)
BASOS PCT: 0 %
Eosinophils Absolute: 0 10*3/uL (ref 0–0.7)
Eosinophils Relative: 0 %
HEMATOCRIT: 36.6 % (ref 35.0–47.0)
Hemoglobin: 12.8 g/dL (ref 12.0–16.0)
LYMPHS ABS: 0.2 10*3/uL — AB (ref 1.0–3.6)
LYMPHS PCT: 3 %
MCH: 33.9 pg (ref 26.0–34.0)
MCHC: 34.8 g/dL (ref 32.0–36.0)
MCV: 97.5 fL (ref 80.0–100.0)
MONOS PCT: 8 %
Monocytes Absolute: 0.5 10*3/uL (ref 0.2–0.9)
NEUTROS ABS: 6.1 10*3/uL (ref 1.4–6.5)
Neutrophils Relative %: 89 %
Platelets: 94 10*3/uL — ABNORMAL LOW (ref 150–440)
RBC: 3.76 MIL/uL — ABNORMAL LOW (ref 3.80–5.20)
RDW: 17.2 % — ABNORMAL HIGH (ref 11.5–14.5)
WBC: 6.8 10*3/uL (ref 3.6–11.0)

## 2017-07-20 SURGERY — FIXATION, FEMUR, NECK, PERCUTANEOUS, USING SCREW
Anesthesia: General | Laterality: Left

## 2017-07-20 MED ORDER — SODIUM CHLORIDE 0.9 % IV SOLN
INTRAVENOUS | Status: DC
  Administered 2017-07-20: 19:00:00 via INTRAVENOUS

## 2017-07-20 MED ORDER — LACTATED RINGERS IV SOLN
INTRAVENOUS | Status: DC | PRN
  Administered 2017-07-20: 16:00:00 via INTRAVENOUS

## 2017-07-20 MED ORDER — ROCURONIUM BROMIDE 100 MG/10ML IV SOLN
INTRAVENOUS | Status: DC | PRN
  Administered 2017-07-20: 25 mg via INTRAVENOUS
  Administered 2017-07-20: 5 mg via INTRAVENOUS

## 2017-07-20 MED ORDER — DEXAMETHASONE SODIUM PHOSPHATE 10 MG/ML IJ SOLN
INTRAMUSCULAR | Status: AC
  Filled 2017-07-20: qty 1

## 2017-07-20 MED ORDER — FENTANYL CITRATE (PF) 100 MCG/2ML IJ SOLN
INTRAMUSCULAR | Status: DC | PRN
  Administered 2017-07-20 (×2): 50 ug via INTRAVENOUS

## 2017-07-20 MED ORDER — SUCCINYLCHOLINE CHLORIDE 20 MG/ML IJ SOLN
INTRAMUSCULAR | Status: AC
  Filled 2017-07-20: qty 1

## 2017-07-20 MED ORDER — ONDANSETRON HCL 4 MG/2ML IJ SOLN
INTRAMUSCULAR | Status: DC | PRN
  Administered 2017-07-20: 4 mg via INTRAVENOUS

## 2017-07-20 MED ORDER — ROCURONIUM BROMIDE 50 MG/5ML IV SOLN
INTRAVENOUS | Status: AC
  Filled 2017-07-20: qty 1

## 2017-07-20 MED ORDER — ACETAMINOPHEN 650 MG RE SUPP: 650.0000 mg | Freq: Four times a day (QID) | RECTAL | Status: DC | PRN

## 2017-07-20 MED ORDER — FLUTICASONE PROPIONATE 50 MCG/ACT NA SUSP
1.0000 | Freq: Two times a day (BID) | NASAL | Status: DC | PRN
  Filled 2017-07-20: qty 16

## 2017-07-20 MED ORDER — ONDANSETRON HCL 4 MG/2ML IJ SOLN: 4.0000 mg | Freq: Four times a day (QID) | INTRAMUSCULAR | Status: DC | PRN

## 2017-07-20 MED ORDER — MORPHINE SULFATE (PF) 2 MG/ML IV SOLN: 2.0000 mg | INTRAVENOUS | Status: DC | PRN

## 2017-07-20 MED ORDER — GLYCOPYRROLATE 0.2 MG/ML IJ SOLN
INTRAMUSCULAR | Status: DC | PRN
  Administered 2017-07-20: 0.2 mg via INTRAVENOUS

## 2017-07-20 MED ORDER — DEXAMETHASONE SODIUM PHOSPHATE 10 MG/ML IJ SOLN
INTRAMUSCULAR | Status: DC | PRN
  Administered 2017-07-20: 5 mg via INTRAVENOUS

## 2017-07-20 MED ORDER — SUCCINYLCHOLINE CHLORIDE 20 MG/ML IJ SOLN
INTRAMUSCULAR | Status: DC | PRN
  Administered 2017-07-20: 80 mg via INTRAVENOUS

## 2017-07-20 MED ORDER — MIDAZOLAM HCL 2 MG/2ML IJ SOLN
INTRAMUSCULAR | Status: AC
  Filled 2017-07-20: qty 2

## 2017-07-20 MED ORDER — MIDAZOLAM HCL 2 MG/2ML IJ SOLN
INTRAMUSCULAR | Status: DC | PRN
Start: 2017-07-20 — End: 2017-07-20
  Administered 2017-07-20: 2 mg via INTRAVENOUS

## 2017-07-20 MED ORDER — LIDOCAINE HCL (CARDIAC) 20 MG/ML IV SOLN
INTRAVENOUS | Status: DC | PRN
Start: 2017-07-20 — End: 2017-07-20
  Administered 2017-07-20: 50 mg via INTRAVENOUS

## 2017-07-20 MED ORDER — LORATADINE 10 MG PO TABS
10.0000 mg | ORAL_TABLET | Freq: Every day | ORAL | Status: DC
  Administered 2017-07-21 – 2017-07-23 (×3): 10 mg via ORAL
  Filled 2017-07-20 (×3): qty 1

## 2017-07-20 MED ORDER — SUGAMMADEX SODIUM 200 MG/2ML IV SOLN
INTRAVENOUS | Status: DC | PRN
  Administered 2017-07-20: 100 mg via INTRAVENOUS

## 2017-07-20 MED ORDER — ONDANSETRON HCL 4 MG PO TABS: 4.0000 mg | ORAL_TABLET | Freq: Four times a day (QID) | ORAL | Status: DC | PRN

## 2017-07-20 MED ORDER — LIDOCAINE HCL (PF) 2 % IJ SOLN
INTRAMUSCULAR | Status: AC
  Filled 2017-07-20: qty 10

## 2017-07-20 MED ORDER — B COMPLEX-C PO TABS
1.0000 | ORAL_TABLET | Freq: Every day | ORAL | Status: DC
  Administered 2017-07-21 – 2017-07-23 (×3): 1 via ORAL
  Filled 2017-07-20 (×3): qty 1

## 2017-07-20 MED ORDER — ATORVASTATIN CALCIUM 10 MG PO TABS
10.0000 mg | ORAL_TABLET | Freq: Every day | ORAL | Status: DC
  Administered 2017-07-21 – 2017-07-23 (×3): 10 mg via ORAL
  Filled 2017-07-20 (×3): qty 1

## 2017-07-20 MED ORDER — FENTANYL CITRATE (PF) 100 MCG/2ML IJ SOLN
25.0000 ug | INTRAMUSCULAR | Status: DC | PRN
  Administered 2017-07-21 (×2): 25 ug via INTRAVENOUS
  Filled 2017-07-20 (×2): qty 2

## 2017-07-20 MED ORDER — ACETAMINOPHEN 325 MG PO TABS
650.0000 mg | ORAL_TABLET | Freq: Four times a day (QID) | ORAL | Status: DC | PRN
  Administered 2017-07-22 (×2): 650 mg via ORAL
  Filled 2017-07-20 (×2): qty 2

## 2017-07-20 MED ORDER — CEFAZOLIN SODIUM 1 G IJ SOLR
INTRAMUSCULAR | Status: AC
  Filled 2017-07-20: qty 10

## 2017-07-20 MED ORDER — PROPOFOL 10 MG/ML IV BOLUS
INTRAVENOUS | Status: AC
  Filled 2017-07-20: qty 20

## 2017-07-20 MED ORDER — GLYCOPYRROLATE 0.2 MG/ML IJ SOLN
INTRAMUSCULAR | Status: AC
  Filled 2017-07-20: qty 1

## 2017-07-20 MED ORDER — ACETAMINOPHEN 500 MG PO TABS
1000.0000 mg | ORAL_TABLET | Freq: Once | ORAL | Status: AC
  Administered 2017-07-20: 1000 mg via ORAL
  Filled 2017-07-20: qty 2

## 2017-07-20 MED ORDER — ONDANSETRON HCL 4 MG/2ML IJ SOLN: 4.0000 mg | Freq: Once | INTRAMUSCULAR | Status: DC | PRN

## 2017-07-20 MED ORDER — SUGAMMADEX SODIUM 200 MG/2ML IV SOLN
INTRAVENOUS | Status: AC
  Filled 2017-07-20: qty 2

## 2017-07-20 MED ORDER — ONDANSETRON HCL 4 MG/2ML IJ SOLN
INTRAMUSCULAR | Status: AC
  Filled 2017-07-20: qty 2

## 2017-07-20 MED ORDER — FENTANYL CITRATE (PF) 100 MCG/2ML IJ SOLN
INTRAMUSCULAR | Status: AC
  Filled 2017-07-20: qty 2

## 2017-07-20 MED ORDER — PROPOFOL 10 MG/ML IV BOLUS
INTRAVENOUS | Status: DC | PRN
  Administered 2017-07-20: 100 mg via INTRAVENOUS

## 2017-07-20 MED ORDER — CEFAZOLIN SODIUM-DEXTROSE 1-4 GM/50ML-% IV SOLN
INTRAVENOUS | Status: DC | PRN
  Administered 2017-07-20: 1 g via INTRAVENOUS

## 2017-07-20 SURGICAL SUPPLY — 33 items
BAG COUNTER SPONGE EZ (MISCELLANEOUS) IMPLANT
BIT DRILL CANN LRG QC 5X300 (BIT) ×2 IMPLANT
CANISTER SUCT 1200ML W/VALVE (MISCELLANEOUS) ×2 IMPLANT
DRSG AQUACEL AG ADV 3.5X 4 (GAUZE/BANDAGES/DRESSINGS) ×2 IMPLANT
DURAPREP 26ML APPLICATOR (WOUND CARE) IMPLANT
ELECT REM PT RETURN 9FT ADLT (ELECTROSURGICAL) ×2
ELECTRODE REM PT RTRN 9FT ADLT (ELECTROSURGICAL) ×1 IMPLANT
GAUZE SPONGE 4X4 12PLY STRL (GAUZE/BANDAGES/DRESSINGS) ×2 IMPLANT
GLOVE BIOGEL PI IND STRL 8 (GLOVE) ×1 IMPLANT
GLOVE BIOGEL PI INDICATOR 8 (GLOVE) ×1
GLOVE INDICATOR 7.5 STRL GRN (GLOVE) ×2 IMPLANT
GLOVE SURG ORTHO 8.0 STRL STRW (GLOVE) ×6 IMPLANT
GOWN STRL REUS W/ TWL LRG LVL3 (GOWN DISPOSABLE) ×2 IMPLANT
GOWN STRL REUS W/TWL LRG LVL3 (GOWN DISPOSABLE) ×2
GUIDEWIRE THREADED 2.8 (WIRE) ×6 IMPLANT
IRRIGATION STRYKERFLOW (MISCELLANEOUS) ×1 IMPLANT
IRRIGATOR STRYKERFLOW (MISCELLANEOUS) ×2
KIT RM TURNOVER CYSTO AR (KITS) ×2 IMPLANT
KIT RM TURNOVER STRD PROC AR (KITS) ×2 IMPLANT
MAT FLOOR SUCTION 50X34 (MISCELLANEOUS) ×2 IMPLANT
NS IRRIG 1000ML POUR BTL (IV SOLUTION) ×2 IMPLANT
PACK HIP COMPR (MISCELLANEOUS) ×2 IMPLANT
SCREW 6.5MM CANN 32X80 (Screw) ×2 IMPLANT
SCREW CANN 32MM 6.5X70MM (Screw) ×4 IMPLANT
SCREW CANN 32MM 6.5X75MM (Screw) ×2 IMPLANT
STAPLER SKIN PROX 35W (STAPLE) ×2 IMPLANT
SUT VIC AB 0 CT1 36 (SUTURE) ×2 IMPLANT
SUT VIC AB 1 CT1 36 (SUTURE) ×4 IMPLANT
SUT VIC AB 2-0 CT1 27 (SUTURE) ×1
SUT VIC AB 2-0 CT1 TAPERPNT 27 (SUTURE) ×1 IMPLANT
TAPE MICROFOAM 4IN (TAPE) IMPLANT
WASHER FOR 5.0 SCREWS (Washer) ×2 IMPLANT
WATER STERILE IRR 1000ML POUR (IV SOLUTION) IMPLANT

## 2017-07-20 NOTE — H&P (Signed)
[facility] NAME: Jameisha Stofko    MR#:  [id number]  DATE OF BIRTH:  [birth year]  DATE OF ADMISSION:  07/20/2017  PRIMARY CARE PHYSICIAN: Idelle Crouch, MD   REQUESTING/REFERRING PHYSICIAN: Dr. Lisa Roca  CHIEF COMPLAINT:   Chief Complaint  Patient presents with  . Fall    HISTORY OF PRESENT ILLNESS:  Rael Tilly  is a 66 y.o. female with a known history of glioblastoma status post craniotomy, currently undergoing chemoradiation, mitral valve prolapse, history of endometriosis who presents to the hospital after a mechanical fall and noted to have a left hip fracture. Patient says she woke up in the middle of night to go to the bathroom and fell. She had no prodromal symptoms prior to her fall like chest pain palpitations dizziness nausea or vomiting. Her husband was able to get her up from the floor to the bed. This morning she was having significant pain still in her leg and therefore came to the ER for further evaluation. She underwent x-ray and CT scan of her left hip which showed a left-sided subcapital fracture of the left femoral neck. Hospitalist services were contacted for further treatment and evaluation.  PAST MEDICAL HISTORY:   Past Medical History:  Diagnosis Date  . Endometriosis   . Glioblastoma ([place])   . Hyperlipidemia   . Mitral valve prolapse     PAST SURGICAL HISTORY:   Past Surgical History:  Procedure Laterality Date  . ABDOMINAL HYSTERECTOMY  2000   Dr. Rayford Halsted  . CESAREAN SECTION     1  . TONSILLECTOMY    . TOTAL ABDOMINAL HYSTERECTOMY W/ BILATERAL SALPINGOOPHORECTOMY  2000    SOCIAL HISTORY:   Social History  Substance Use Topics  . Smoking status: Never Smoker  . Smokeless tobacco: Never Used  . Alcohol use No    FAMILY HISTORY:   Family History  Problem Relation Age of Onset  . Cancer Father        Bladder - 90's  . Hyperlipidemia Father   . Heart disease Father   . Cancer Maternal  Aunt        Breast - 76's  . Breast cancer Maternal Aunt 82    DRUG ALLERGIES:  No Known Allergies  REVIEW OF SYSTEMS:   Review of Systems  Constitutional: Negative for fever and weight loss.  HENT: Negative for congestion, nosebleeds and tinnitus.   Eyes: Negative for blurred vision, double vision and redness.  Respiratory: Negative for cough, hemoptysis and shortness of breath.   Cardiovascular: Negative for chest pain, orthopnea, leg swelling and PND.  Gastrointestinal: Negative for abdominal pain, diarrhea, melena, nausea and vomiting.  Genitourinary: Negative for dysuria, hematuria and urgency.  Musculoskeletal: Positive for falls and joint pain (left Hip).  Neurological: Negative for dizziness, tingling, sensory change, focal weakness, seizures, weakness and headaches.  Endo/Heme/Allergies: Negative for polydipsia. Does not bruise/bleed easily.  Psychiatric/Behavioral: Negative for depression and memory loss. The patient is not nervous/anxious.     MEDICATIONS AT HOME:   Prior to Admission medications   Medication Sig Start Date End Date Taking? Authorizing Provider  atorvastatin (LIPITOR) 20 MG tablet Take 0.5 tablets (10 mg total) by mouth daily. 01/02/12  Yes Jackolyn Confer, MD  b complex vitamins tablet Take 1 tablet by mouth daily.   Yes [provider]  cetirizine (ZYRTEC) 10 MG tablet TAKE 1 TABLET (10 MG TOTAL) BY MOUTH DAILY. 12/21/12   Jackolyn Confer, MD  conjugated estrogens (PREMARIN) vaginal cream Place 0.35 Applicatorfuls vaginally 2 (two) times a week. Or as directed Patient not taking: Reported on 07/20/2017 06/24/12   Jackolyn Confer, MD  fluticasone [facility]) 50 MCG/ACT nasal spray Place 1-2 sprays into both nostrils 2 (two) times daily. 06/24/17   [provider]      VITAL SIGNS:  Blood pressure (!) 165/114, pulse 81, temperature 98.2 F (36.8 C), temperature source Oral, resp. rate 18, height 5' (1.524 m), weight 44.9 kg (99  lb), SpO2 98 %.  PHYSICAL EXAMINATION:  Physical Exam  GENERAL:  66 y.o.-year-old patient lying in the bed with no acute distress.  EYES: Pupils equal, round, reactive to light and accommodation. No scleral icterus. Extraocular muscles intact.  HEENT: Head atraumatic, normocephalic. Oropharynx and nasopharynx clear. No oropharyngeal erythema, moist oral mucosa  NECK:  Supple, no jugular venous distention. No thyroid enlargement, no tenderness.  LUNGS: Normal breath sounds bilaterally, no wheezing, rales, rhonchi. No use of accessory muscles of respiration.  CARDIOVASCULAR: S1, S2 RRR. No murmurs, rubs, gallops, clicks.  ABDOMEN: Soft, nontender, nondistended. Bowel sounds present. No organomegaly or mass.  EXTREMITIES: No pedal edema, cyanosis, or clubbing. + 2 pedal & radial pulses b/l.   NEUROLOGIC: Cranial nerves II through XII are intact. No focal Motor or sensory deficits appreciated b/l PSYCHIATRIC: The patient is alert and oriented x 3. SKIN: No obvious rash, lesion, or ulcer.   LABORATORY PANEL:   CBC  Recent Labs Lab 07/20/17 1243  WBC 6.8  HGB 12.8  HCT 36.6  PLT 94*   ------------------------------------------------------------------------------------------------------------------  Chemistries   Recent Labs Lab 07/20/17 1243  NA 135  K 3.6  CL 107  CO2 24  GLUCOSE 144*  BUN 23*  CREATININE 0.89  CALCIUM 9.5   ------------------------------------------------------------------------------------------------------------------  Cardiac Enzymes No results for input(s): TROPONINI in the last 168 hours. ------------------------------------------------------------------------------------------------------------------  RADIOLOGY:  Ct Hip Left Wo Contrast  Result Date: 07/20/2017 CLINICAL DATA:  Status post fall.  Left hip pain. EXAM: CT OF THE LEFT HIP WITHOUT CONTRAST TECHNIQUE: Multidetector CT imaging of the left hip was performed according to the standard  protocol. Multiplanar CT image reconstructions were also generated. COMPARISON:  None. FINDINGS: Bones/Joint/Cartilage Impacted, nondisplaced left subcapital hip fracture. No other fracture or dislocation. Normal alignment. No joint effusion. No lytic or sclerotic osseous lesion. Ligaments Ligaments are suboptimally evaluated by CT. Muscles and Tendons Muscles are normal.  No muscle atrophy. Soft tissue No fluid collection or hematoma.  No soft tissue mass. IMPRESSION: Acute impacted, nondisplaced left subcapital hip fracture. Electronically Signed   By: Kathreen Devoid   On: 07/20/2017 14:05   Dg Chest Port 1 View  Result Date: 07/20/2017 CLINICAL DATA:  Known hip fracture. EXAM: PORTABLE CHEST 1 VIEW COMPARISON:  None. FINDINGS: The heart size and mediastinal contours are within normal limits. Both lungs are clear. The visualized skeletal structures are unremarkable. IMPRESSION: No active disease. Electronically Signed   By: Dorise Bullion III M.D   On: 07/20/2017 13:03   Dg Femur Min 2 Views Left  Result Date: 07/20/2017 CLINICAL DATA:  Fall, left hip pain EXAM: LEFT FEMUR 2 VIEWS COMPARISON:  None available FINDINGS: Patchy osteopenia. Cortical step-off and impaction present of the left subcapital femoral neck concerning for an impacted acute fracture. No Associated subluxation or dislocation. Visualized left hemipelvis intact. Distal aspect of the left femur appears intact. IMPRESSION: Findings concerning for a left subcapital impacted femoral neck fracture by plain radiography. Electronically Signed   By:  M.  Shick M.D.   On: 07/20/2017 11:41     IMPRESSION AND PLAN:   66 year old female with past medical history of glioblastoma status post craniotomy, currently undergoing chemoradiation, hyperlipidemia, endometriosis, history of mitral prolapse who presents to the hospital after a mechanical fall and noted to have a left hip fracture.  1. Status post fall and left hip fracture-patient is a low  to moderate risk for noncardiac surgery. No absolute contraindication to surgery at this time. -Preoperative EKG has been reviewed and shows no acute ST or T-wave changes. -Seen by orthopedics and plan for surgery later today. Pain control with as needed IV morphine. She will need physical therapy consult post surgical intervention.  2. Thrombocytopenia-secondary to patient currently getting therapies given her history of glioblastoma. -Follow platelet count. No acute bleeding presently.  3. History of glioblastoma status post craniotomy-patient is extensively followed at [facility]. Currently undergoing chemotherapy.  4. Hyperlipidemia-continue Lipitor.    All the records are reviewed and case discussed with ED provider. Management plans discussed with the patient, family and they are in agreement.  CODE STATUS: Full code  TOTAL TIME TAKING CARE OF THIS PATIENT: 45 minutes.    Henreitta Leber M.D on 07/20/2017 at 2:25 PM  Between 7am to 6pm - Pager - [phone]  After 6pm go to [email or web address] - password [email or web address] [place] [facility]  Office  [phone]  CC: Primary care physician; Idelle Crouch, MD

## 2017-07-20 NOTE — Anesthesia Preprocedure Evaluation (Signed)
Anesthesia Evaluation  Patient identified by MRN, date of birth, ID band Patient awake    Reviewed: Allergy & Precautions, NPO status , Patient's Chart, lab work & pertinent test results, reviewed documented beta blocker date and time   Airway Mallampati: II  TM Distance: >3 FB     Dental  (+) Chipped   Pulmonary           Cardiovascular + Valvular Problems/Murmurs      Neuro/Psych    GI/Hepatic   Endo/Other    Renal/GU      Musculoskeletal   Abdominal   Peds  Hematology   Anesthesia Other Findings MVP. Glioblastoma receiving Rx. Hypertensive. Possible seizures according to husband. Some headaches but no blurred vision.  Reproductive/Obstetrics                            Anesthesia Physical Anesthesia Plan  ASA: III  Anesthesia Plan: General   Post-op Pain Management:    Induction: Intravenous  PONV Risk Score and Plan:   Airway Management Planned: Oral ETT  Additional Equipment:   Intra-op Plan:   Post-operative Plan:   Informed Consent: I have reviewed the patients History and Physical, chart, labs and discussed the procedure including the risks, benefits and alternatives for the proposed anesthesia with the patient or authorized representative who has indicated his/her understanding and acceptance.     Plan Discussed with: CRNA  Anesthesia Plan Comments:         Anesthesia Quick Evaluation

## 2017-07-20 NOTE — ED Notes (Signed)
First Nurse Note: Pt states that she was getting out of bed this morning and fell. Pt unable to bear weight on the left leg. Swelling noted in the left upper leg. Pt states that she is also a cancer patient

## 2017-07-20 NOTE — Anesthesia Post-op Follow-up Note (Signed)
Anesthesia QCDR form completed.        

## 2017-07-20 NOTE — Consult Note (Signed)
ORTHOPAEDIC CONSULTATION  REQUESTING PHYSICIAN: Lisa Roca, MD  Chief Complaint: Left hip pain  HPI: Kelsey Woodard is a 66 y.o. female who complains of  Left hip pain and inability to bear weight after a ground level fall this morning.  Past Medical History:  Diagnosis Date  . Endometriosis   . Glioblastoma ([place])   . Hyperlipidemia   . Mitral valve prolapse    Past Surgical History:  Procedure Laterality Date  . ABDOMINAL HYSTERECTOMY  2000   Dr. Rayford Halsted  . CESAREAN SECTION     1  . TONSILLECTOMY    . TOTAL ABDOMINAL HYSTERECTOMY W/ BILATERAL SALPINGOOPHORECTOMY  2000   Social History   Social History  . Marital status: Married    Spouse name: N/A  . Number of children: 1  . Years of education: N/A   Occupational History  . Unemployed Unemployed   Social History Main Topics  . Smoking status: Never Smoker  . Smokeless tobacco: Never Used  . Alcohol use No  . Drug use: No  . Sexual activity: Not Asked   Other Topics Concern  . None   Social History Narrative   Lives in [place] with husband. 2 cats and dog in house. Work - office, Pharmacist, hospital in past.      Diet: regular, healthy, increased fruits and vegs   Exercise: walks 5 days per week   Family History  Problem Relation Age of Onset  . Cancer Father        Bladder - 5's  . Hyperlipidemia Father   . Heart disease Father   . Cancer Maternal Aunt        Breast - 26's  . Breast cancer Maternal Aunt 82   No Known Allergies Prior to Admission medications   Medication Sig Start Date End Date Taking? Authorizing Provider  atorvastatin (LIPITOR) 20 MG tablet Take 0.5 tablets (10 mg total) by mouth daily. 01/02/12  Yes Jackolyn Confer, MD  b complex vitamins tablet Take 1 tablet by mouth daily.   Yes [provider]  cetirizine (ZYRTEC) 10 MG tablet TAKE 1 TABLET (10 MG TOTAL) BY MOUTH DAILY. 12/21/12   Jackolyn Confer, MD  conjugated estrogens (PREMARIN) vaginal cream Place 6.04 Applicatorfuls  vaginally 2 (two) times a week. Or as directed Patient not taking: Reported on 07/20/2017 06/24/12   Jackolyn Confer, MD  fluticasone [facility]) 50 MCG/ACT nasal spray Place 1-2 sprays into both nostrils 2 (two) times daily. 06/24/17   [provider]   Dg Chest Port 1 View  Result Date: 07/20/2017 CLINICAL DATA:  Known hip fracture. EXAM: PORTABLE CHEST 1 VIEW COMPARISON:  None. FINDINGS: The heart size and mediastinal contours are within normal limits. Both lungs are clear. The visualized skeletal structures are unremarkable. IMPRESSION: No active disease. Electronically Signed   By: Dorise Bullion III M.D   On: 07/20/2017 13:03   Dg Femur Min 2 Views Left  Result Date: 07/20/2017 CLINICAL DATA:  Fall, left hip pain EXAM: LEFT FEMUR 2 VIEWS COMPARISON:  None available FINDINGS: Patchy osteopenia. Cortical step-off and impaction present of the left subcapital femoral neck concerning for an impacted acute fracture. No Associated subluxation or dislocation. Visualized left hemipelvis intact. Distal aspect of the left femur appears intact. IMPRESSION: Findings concerning for a left subcapital impacted femoral neck fracture by plain radiography. Electronically Signed   By: Jerilynn Mages.  Shick M.D.   On: 07/20/2017 11:41    Positive ROS: All other systems have been reviewed and  were otherwise negative with the exception of those mentioned in the HPI and as above.  Physical Exam: General: Alert, no acute distress Cardiovascular: No pedal edema Respiratory: No cyanosis, no use of accessory musculature GI: No organomegaly, abdomen is soft and non-tender Skin: No lesions in the area of chief complaint Neurologic: Sensation intact distally Psychiatric: Patient is competent for consent with normal mood and affect Lymphatic: No axillary or cervical lymphadenopathy  MUSCULOSKELETAL: Equal limb lengths bilaterally. Palpable DP/PT pulses with intact sensation to light touch.  Assessment: 66 y/o  female with left valgus-impacted femoral neck fracture.  Plan: Admit to medicine for medical optimization and risk stratification. Plan for percutaneous screw fixation of her femoral neck fracture this afternoon.       07/20/2017 1:47 PM

## 2017-07-20 NOTE — Anesthesia Procedure Notes (Signed)
Procedure Name: Intubation Date/Time: 07/20/2017 5:00 PM Performed by: Rolla Plate Pre-anesthesia Checklist: Patient identified, Patient being monitored, Timeout performed, Emergency Drugs available and Suction available Patient Re-evaluated:Patient Re-evaluated prior to induction Oxygen Delivery Method: Circle system utilized Preoxygenation: Pre-oxygenation with 100% oxygen Induction Type: IV induction Ventilation: Mask ventilation without difficulty Laryngoscope Size: Miller and 2 Grade View: Grade I Tube type: Oral Tube size: 7.0 mm Number of attempts: 1 Airway Equipment and Method: Stylet Placement Confirmation: ETT inserted through vocal cords under direct vision,  positive ETCO2 and breath sounds checked- equal and bilateral Secured at: 21 cm Tube secured with: Tape Dental Injury: Teeth and Oropharynx as per pre-operative assessment

## 2017-07-20 NOTE — Transfer of Care (Signed)
Immediate Anesthesia Transfer of Care Note  Patient: Kelsey Woodard  Procedure(s) Performed: CANNULATED HIP PINNING (Left )  Patient Location: PACU  Anesthesia Type:General  Level of Consciousness: sedated  Airway & Oxygen Therapy: Patient Spontanous Breathing and Patient connected to face mask oxygen  Post-op Assessment: Report given to RN and Post -op Vital signs reviewed and stable  Post vital signs: Reviewed  Last Vitals:  Vitals:   07/20/17 1451 07/20/17 1714  BP: 135/70 (!) 170/98  Pulse: 67 (!) 117  Resp: 15 17  Temp:    SpO2: 100% 100%    Last Pain:  Vitals:   07/20/17 1453  TempSrc:   PainSc: 0-No pain         Complications: No apparent anesthesia complications

## 2017-07-20 NOTE — Op Note (Signed)
07/20/2017  5:13 PM  PATIENT:  Kelsey Woodard    PRE-OPERATIVE DIAGNOSIS:  left hip fracture  POST-OPERATIVE DIAGNOSIS:  Same  PROCEDURE:  CANNULATED HIP PINNING  SURGEON:  Stacy Gardner, MD  PHYSICIAN ASSISTANT: None  ANESTHESIA:   General  PREOPERATIVE INDICATIONS:  Kelsey Woodard is a  66 y.o. female who fell and was found to have a diagnosis of left impacted femoral neck fracture who elected for surgical management.    The risks benefits and alternatives were discussed with the patient preoperatively including but not limited to the risks of infection, bleeding, nerve injury, cardiopulmonary complications, blood clots, malunion, nonunion, avascular necrosis, the need for revision surgery, the potential for conversion to hemiarthroplasty, among others, and the patient was willing to proceed.  OPERATIVE IMPLANTS: 6.5 mm cannulated screws x3, one washer  OPERATIVE FINDINGS: Clinical osteoporosis with weak bone, proximal femur  OPERATIVE PROCEDURE: The patient was brought to the operating room and placed in supine position. IV antibiotics were given. General anesthesia administered. Foley was also given. The patient was placed on the fracture table. The operative extremity was positioned, without any significant reduction maneuver and was prepped and draped in usual sterile fashion.  Time out was performed.  3 guidewires were introduced Into an inverted triangle configuration. The lengths were measured. The reduction was slightly valgus, and near-anatomic. I opened the cortex with a cannulated drill, and then placed the screws into position. Satisfactory fixation was achieved.  The wounds were irrigated copiously, and repaired with Vicryl and staplese. There no complications and the patient tolerated the procedure well.  The patient will be foot flat weightbearing for 6 weeks post-op.

## 2017-07-20 NOTE — ED Provider Notes (Signed)
[facility] Emergency Department Provider Note ____________________________________________   I have reviewed the triage vital signs and the triage nursing note.  HISTORY  Chief Complaint Fall   Historian Patient and husband  HPI Kelsey Woodard is a 66 y.o. female who is currently being treated at [facility] for glioblastoma, presents after fall early this morning in the middle the night around 3 AM.  Patient has been a little bit disoriented as a result of the brain cancer, but no worse than normal.  Patient fell onto carpet onto her left thigh.  Husband gave her Tylenol in the night as that the only thing she can take, and this morning she was still having significant pain in the mid thigh and having trouble bearing weight.  No head injury.  No neck injury.  No chest or abdominal pain.  No other extremity pains except for the left thigh which is moderate.   Past Medical History:  Diagnosis Date  . Endometriosis   . Glioblastoma ([place])   . Hyperlipidemia   . Mitral valve prolapse     Patient Active Problem List   Diagnosis Date Noted  . Hyperlipidemia LDL goal < 100 01/02/2012  . Seasonal allergies 01/02/2012  . Atrophic vaginitis 01/02/2012  . Vaccine for VZV (varicella-zoster virus) 01/02/2012    Past Surgical History:  Procedure Laterality Date  . ABDOMINAL HYSTERECTOMY  2000   Dr. Rayford Halsted  . CESAREAN SECTION     1  . TONSILLECTOMY    . TOTAL ABDOMINAL HYSTERECTOMY W/ BILATERAL SALPINGOOPHORECTOMY  2000    Prior to Admission medications   Medication Sig Start Date End Date Taking? Authorizing Provider  atorvastatin (LIPITOR) 20 MG tablet Take 0.5 tablets (10 mg total) by mouth daily. 01/02/12  Yes Jackolyn Confer, MD  b complex vitamins tablet Take 1 tablet by mouth daily.   Yes [provider]  cetirizine (ZYRTEC) 10 MG tablet TAKE 1 TABLET (10 MG TOTAL) BY MOUTH DAILY. 12/21/12   Jackolyn Confer, MD  conjugated estrogens (PREMARIN)  vaginal cream Place 5.36 Applicatorfuls vaginally 2 (two) times a week. Or as directed Patient not taking: Reported on 07/20/2017 06/24/12   Jackolyn Confer, MD  fluticasone [facility]) 50 MCG/ACT nasal spray Place 1-2 sprays into both nostrils 2 (two) times daily. 06/24/17   [provider]    No Known Allergies  Family History  Problem Relation Age of Onset  . Cancer Father        Bladder - 44's  . Hyperlipidemia Father   . Heart disease Father   . Cancer Maternal Aunt        Breast - 67's  . Breast cancer Maternal Aunt 82    Social History Social History  Substance Use Topics  . Smoking status: Never Smoker  . Smokeless tobacco: Never Used  . Alcohol use No    Review of Systems  Constitutional: Negative for fever. Eyes: Negative for visual changes. ENT: Negative for sore throat. Cardiovascular: Negative for chest pain. Respiratory: Negative for shortness of breath. Gastrointestinal: Negative for abdominal pain, vomiting and diarrhea. Genitourinary: Negative for dysuria. Musculoskeletal: Negative for back pain. Skin: Negative for rash. Neurological: Negative for headache.  ____________________________________________   PHYSICAL EXAM:  VITAL SIGNS: ED Triage Vitals  Enc Vitals Group     BP 07/20/17 1009 (!) 145/101     Pulse Rate 07/20/17 1009 77     Resp 07/20/17 1009 18     Temp 07/20/17 1009 98.2 F (36.8  C)     Temp Source 07/20/17 1009 Oral     SpO2 07/20/17 1009 98 %     Weight 07/20/17 1010 99 lb (44.9 kg)     Height 07/20/17 1010 5' (1.524 m)     Head Circumference --      Peak Flow --      Pain Score 07/20/17 1009 6     Pain Loc --      Pain Edu? --      Excl. in [place]? --      Constitutional: Alert and oriented. Well appearing and in no distress. HEENT   Head: Normocephalic and atraumatic.      Eyes: Conjunctivae are normal. Pupils equal and round.       Ears:         Nose: No congestion/rhinnorhea.   Mouth/Throat: Mucous  membranes are moist.   Neck: No stridor. Cardiovascular/Chest: Normal rate, regular rhythm.  No murmurs, rubs, or gallops. Respiratory: Normal respiratory effort without tachypnea nor retractions. Breath sounds are clear and equal bilaterally. No wheezes/rales/rhonchi. Gastrointestinal: Soft. No distention, no guarding, no rebound. Nontender.    Genitourinary/rectal:Deferred Musculoskeletal: Pelvis stable.  No deformity.  Neurovascular intact distally left lower extremity.  Tenderness to the mid thigh without hematoma. Neurologic:  Normal speech and language. No gross or focal neurologic deficits are appreciated. Skin:  Skin is warm, dry and intact. No rash noted. Psychiatric: Mood and affect are normal. Speech and behavior are normal. Patient exhibits appropriate insight and judgment.   ____________________________________________  LABS (pertinent positives/negatives) I, Lisa Roca, MD the attending physician have reviewed the labs noted below.  Labs Reviewed  URINALYSIS, COMPLETE (UACMP) WITH MICROSCOPIC - Abnormal; Notable for the following:       Result Value   Color, Urine YELLOW (*)    APPearance CLEAR (*)    Glucose, UA 150 (*)    Hgb urine dipstick MODERATE (*)    All other components within normal limits  BASIC METABOLIC PANEL - Abnormal; Notable for the following:    Glucose, Bld 144 (*)    BUN 23 (*)    Anion gap 4 (*)    All other components within normal limits  CBC WITH DIFFERENTIAL/PLATELET - Abnormal; Notable for the following:    RBC 3.76 (*)    RDW 17.2 (*)    Platelets 94 (*)    Lymphs Abs 0.2 (*)    All other components within normal limits  TYPE AND SCREEN    ____________________________________________    EKG I, Lisa Roca, MD, the attending physician have personally viewed and interpreted all ECGs.  Pending ____________________________________________  RADIOLOGY All Xrays were viewed by me.  Imaging interpreted by Radiologist, and I,  Lisa Roca, MD the attending physician have reviewed the radiologist interpretation noted below.  Left hip:  FINDINGS: Patchy osteopenia. Cortical step-off and impaction present of the left subcapital femoral neck concerning for an impacted acute fracture. No Associated subluxation or dislocation. Visualized left hemipelvis intact. Distal aspect of the left femur appears intact.  IMPRESSION: Findings concerning for a left subcapital impacted femoral neck fracture by plain radiography.  CXR: No active disease.  CT left hip: Pending __________________________________________  PROCEDURES  Procedure(s) performed: None  Critical Care performed: None  ____________________________________________  No current facility-administered medications on file prior to encounter.    Current Outpatient Prescriptions on File Prior to Encounter  Medication Sig Dispense Refill  . atorvastatin (LIPITOR) 20 MG tablet Take 0.5 tablets (10 mg total) by mouth  daily. 90 tablet 3  . b complex vitamins tablet Take 1 tablet by mouth daily.    . cetirizine (ZYRTEC) 10 MG tablet TAKE 1 TABLET (10 MG TOTAL) BY MOUTH DAILY. 30 tablet 2  . conjugated estrogens (PREMARIN) vaginal cream Place 0.25 Applicatorfuls vaginally 2 (two) times a week. Or as directed (Patient not taking: Reported on 07/20/2017) 42.5 g 6    ____________________________________________  ED COURSE / ASSESSMENT AND PLAN  Pertinent labs & imaging results that were available during my care of the patient were reviewed by me and considered in my medical decision making (see chart for details).   Patient without significant pain on exam at rest, however she cannot bear weight.  X-ray shows concerning for left subcapital hip fracture.  Patient will be placed with a Foley catheter as she is to urinate right now I do not get her up.  I spoke with the patient's cancer doctor on-call who recommends patient does not need to transfer to [facility]  from an oncologic perspective and could be fixed here from an orthopedic perspective.  I discussed with the patient and her husband about the options, and they are agreeable to stay here [place] for hip repair.  Dr. Daine Gip recommends CT to differentiate for type of surgery.   DIFFERENTIAL DIAGNOSIS: Including but not limited to contusion, femur fracture, hip fracture, knee fracture, etc.  CONSULTATIONS:   I spoke with the on-call cancer doctor at [facility], recommends the patient does not necessarily need transfer due from an oncologic standpoint.  Dr. Daine Gip, orthopedics for surgery here. Hospitalist for medical evaluation.    Patient / Family / Caregiver informed of clinical course, medical decision-making process, and agree with plan.   ___________________________________________   FINAL CLINICAL IMPRESSION(S) / ED DIAGNOSES   Final diagnoses:  Subcapital fracture of hip, left, closed, initial encounter [facility])              Note: This dictation was prepared with Dragon dictation. Any transcriptional errors that result from this process are unintentional    Lisa Roca, MD 07/20/17 1354

## 2017-07-20 NOTE — ED Triage Notes (Signed)
Patient presents to the ED post fall this morning while trying to get to the bathroom.  Patient denies syncope.  Patient states she did hit the back of her head when she fell.  Patient is complaining of left upper leg pain.  Patient reports she has brain cancer and get chemo treatments every other week.

## 2017-07-21 ENCOUNTER — Encounter: Payer: Self-pay | Admitting: Orthopedic Surgery

## 2017-07-21 LAB — BASIC METABOLIC PANEL
ANION GAP: 1 — AB (ref 5–15)
BUN: 16 mg/dL (ref 6–20)
CALCIUM: 8.6 mg/dL — AB (ref 8.9–10.3)
CHLORIDE: 116 mmol/L — AB (ref 101–111)
CO2: 24 mmol/L (ref 22–32)
Creatinine, Ser: 0.88 mg/dL (ref 0.44–1.00)
GFR calc non Af Amer: >60 mL/min (ref >60)
Glucose, Bld: 107 mg/dL — ABNORMAL HIGH (ref 65–99)
Potassium: 3.7 mmol/L (ref 3.5–5.1)
SODIUM: 141 mmol/L (ref 135–145)

## 2017-07-21 LAB — CBC
HCT: 32.7 % — ABNORMAL LOW (ref 35.0–47.0)
HEMOGLOBIN: 11.2 g/dL — AB (ref 12.0–16.0)
MCH: 33.4 pg (ref 26.0–34.0)
MCHC: 34.3 g/dL (ref 32.0–36.0)
MCV: 97.4 fL (ref 80.0–100.0)
Platelets: 92 10*3/uL — ABNORMAL LOW (ref 150–440)
RBC: 3.36 MIL/uL — AB (ref 3.80–5.20)
RDW: 16.8 % — ABNORMAL HIGH (ref 11.5–14.5)
WBC: 5.7 10*3/uL (ref 3.6–11.0)

## 2017-07-21 MED ORDER — SENNA 8.6 MG PO TABS
2.0000 | ORAL_TABLET | Freq: Every day | ORAL | Status: DC
  Administered 2017-07-21 – 2017-07-23 (×3): 17.2 mg via ORAL
  Filled 2017-07-21 (×3): qty 2

## 2017-07-21 MED ORDER — HALOPERIDOL LACTATE 5 MG/ML IJ SOLN
2.0000 mg | Freq: Once | INTRAMUSCULAR | Status: AC
  Administered 2017-07-21: 2 mg via INTRAVENOUS
  Filled 2017-07-21: qty 1

## 2017-07-21 MED ORDER — MORPHINE SULFATE (PF) 2 MG/ML IV SOLN: 1.0000 mg | Freq: Three times a day (TID) | INTRAVENOUS | Status: DC | PRN

## 2017-07-21 MED ORDER — ENOXAPARIN SODIUM 40 MG/0.4ML ~~LOC~~ SOLN
40.0000 mg | SUBCUTANEOUS | Status: DC
  Administered 2017-07-21: 40 mg via SUBCUTANEOUS
  Filled 2017-07-21: qty 0.4

## 2017-07-21 MED ORDER — HYDROCORTISONE 10 MG PO TABS
10.0000 mg | ORAL_TABLET | Freq: Every day | ORAL | Status: DC
  Administered 2017-07-21 – 2017-07-22 (×2): 10 mg via ORAL
  Filled 2017-07-21 (×3): qty 1

## 2017-07-21 MED ORDER — HYDROCORTISONE 10 MG PO TABS
20.0000 mg | ORAL_TABLET | Freq: Every day | ORAL | Status: DC
  Administered 2017-07-21 – 2017-07-23 (×3): 20 mg via ORAL
  Filled 2017-07-21 (×2): qty 2
  Filled 2017-07-21: qty 1

## 2017-07-21 MED ORDER — OXYCODONE-ACETAMINOPHEN 5-325 MG PO TABS: 1.0000 | ORAL_TABLET | Freq: Four times a day (QID) | ORAL | Status: DC | PRN

## 2017-07-21 MED ORDER — TRAMADOL HCL 50 MG PO TABS: 50.0000 mg | ORAL_TABLET | Freq: Four times a day (QID) | ORAL | Status: DC | PRN

## 2017-07-21 NOTE — NC FL2 (Signed)
[place] LEVEL OF CARE SCREENING TOOL     IDENTIFICATION  Patient Name: Kelsey Woodard Birthdate: July 23, 1951 Sex: female Admission Date (Current Location): 07/20/2017  [place] and [place] Number:  Engineering geologist and Address:  [facility], [address] [place], [place] [address]      Provider Number: [id number]  Attending Physician Name and Address:  Fritzi Mandes, MD  Relative Name and Phone Number:       Current Level of Care: Hospital Recommended Level of Care: [place] Prior Approval Number:    Date Approved/Denied:   PASRR Number:  [id number])  Discharge Plan: SNF    Current Diagnoses: Patient Active Problem List   Diagnosis Date Noted  . Closed left hip fracture ([place]) 07/20/2017  . Hyperlipidemia LDL goal < 100 01/02/2012  . Seasonal allergies 01/02/2012  . Atrophic vaginitis 01/02/2012  . Vaccine for VZV (varicella-zoster virus) 01/02/2012    Orientation RESPIRATION BLADDER Height & Weight     Self, Place  Normal Continent Weight: 99 lb (44.9 kg) Height:  5' (152.4 cm)  BEHAVIORAL SYMPTOMS/MOOD NEUROLOGICAL BOWEL NUTRITION STATUS      Continent Diet (Diet: Heart Healthy )  AMBULATORY STATUS COMMUNICATION OF NEEDS Skin   Extensive Assist Verbally Surgical wounds (Incision: Left Hip. )                       Personal Care Assistance Level of Assistance  Bathing, Feeding, Dressing Bathing Assistance: Limited assistance Feeding assistance: Independent Dressing Assistance: Limited assistance     Functional Limitations Info  Sight, Hearing, Speech Sight Info: Adequate Hearing Info: Adequate Speech Info: Adequate    SPECIAL CARE FACTORS FREQUENCY  PT (By licensed PT), OT (By licensed OT)     PT Frequency:  (5) OT Frequency:  (5)            Contractures      Additional Factors Info  Code Status, Allergies Code Status Info:  (Full Code. ) Allergies Info:  (No Known Allergies.  )           Current Medications (07/21/2017):  This is the current hospital active medication list Current Facility-Administered Medications  Medication Dose Route Frequency Provider Last Rate Last Dose  . acetaminophen (TYLENOL) tablet 650 mg  650 mg Oral Q6H PRN Henreitta Leber, MD       Or  . acetaminophen (TYLENOL) suppository 650 mg  650 mg Rectal Q6H PRN Henreitta Leber, MD      . atorvastatin (LIPITOR) tablet 10 mg  10 mg Oral Daily Henreitta Leber, MD   10 mg at 07/21/17 0923  . B-complex with vitamin C tablet 1 tablet  1 tablet Oral Daily Henreitta Leber, MD   1 tablet at 07/21/17 0924  . enoxaparin (LOVENOX) injection 40 mg  40 mg Subcutaneous Q24H McGhee, James Lance, PA-C      . fluticasone (FLONASE) 50 MCG/ACT nasal spray 1-2 spray  1-2 spray Each Nare BID PRN Henreitta Leber, MD      . hydrocortisone (CORTEF) tablet 10 mg  10 mg Oral QAC supper Fritzi Mandes, MD      . hydrocortisone (CORTEF) tablet 20 mg  20 mg Oral Daily Fritzi Mandes, MD   20 mg at 07/21/17 5621  . loratadine (CLARITIN) tablet 10 mg  10 mg Oral Daily Henreitta Leber, MD   10 mg at 07/21/17 3086  . morphine 2 MG/ML injection 1  mg  1 mg Intravenous Q8H PRN Fritzi Mandes, MD      . ondansetron [facility]) tablet 4 mg  4 mg Oral Q6H PRN Henreitta Leber, MD       Or  . ondansetron (ZOFRAN) injection 4 mg  4 mg Intravenous Q6H PRN Henreitta Leber, MD      . ondansetron [facility]) injection 4 mg  4 mg Intravenous Once PRN Gunnar Bulla, MD      . senna (SENOKOT) tablet 17.2 mg  2 tablet Oral Daily Fritzi Mandes, MD      . traMADol Veatrice Bourbon) tablet 50 mg  50 mg Oral Q6H PRN Fritzi Mandes, MD         Discharge Medications: Please see discharge summary for a list of discharge medications.  Relevant Imaging Results:  Relevant Lab Results:   Additional Information  (SSN: [id number])  Kelsey Woodard, [name], LCSW

## 2017-07-21 NOTE — Progress Notes (Signed)
[place] at [place] NAME: Kelsey Woodard    MR#:  [id number]  DATE OF BIRTH:  [birth year]  SUBJECTIVE:   Came in after having mechanical fall at home. Husband in the room. Some confusion and intermittent since last night.  REVIEW OF SYSTEMS:   Review of Systems  Constitutional: Negative for chills, fever and weight loss.  HENT: Negative for ear discharge, ear pain and nosebleeds.   Eyes: Negative for blurred vision, pain and discharge.  Respiratory: Negative for sputum production, shortness of breath, wheezing and stridor.   Cardiovascular: Negative for chest pain, palpitations, orthopnea and PND.  Gastrointestinal: Negative for abdominal pain, diarrhea, nausea and vomiting.  Genitourinary: Negative for frequency and urgency.  Musculoskeletal: Positive for joint pain. Negative for back pain.  Neurological: Positive for weakness. Negative for sensory change, speech change and focal weakness.  Psychiatric/Behavioral: Negative for depression and hallucinations. The patient is not nervous/anxious.    Tolerating Diet:yes Tolerating PT: pending  DRUG ALLERGIES:  No Known Allergies  VITALS:  Blood pressure (!) 147/62, pulse 86, temperature 99.5 F (37.5 C), temperature source Oral, resp. rate 18, height 5' (1.524 m), weight 44.9 kg (99 lb), SpO2 99 %.  PHYSICAL EXAMINATION:   Physical Exam  GENERAL:  66 y.o.-year-old patient lying in the bed with no acute distress.  EYES: Pupils equal, round, reactive to light and accommodation. No scleral icterus. Extraocular muscles intact.  HEENT: Head atraumatic, normocephalic. Oropharynx and nasopharynx clear.  NECK:  Supple, no jugular venous distention. No thyroid enlargement, no tenderness.  LUNGS: Normal breath sounds bilaterally, no wheezing, rales, rhonchi. No use of accessory muscles of respiration.  CARDIOVASCULAR: S1, S2 normal. No murmurs, rubs, or gallops.  ABDOMEN: Soft, nontender,  nondistended. Bowel sounds present. No organomegaly or mass.  EXTREMITIES: No cyanosis, clubbing or edema b/l.    NEUROLOGIC: Cranial nerves II through XII are intact. No focal Motor or sensory deficits b/l.   PSYCHIATRIC:  patient is alert but sleepy SKIN: No obvious rash, lesion, or ulcer.   LABORATORY PANEL:  CBC  Recent Labs Lab 07/21/17 0355  WBC 5.7  HGB 11.2*  HCT 32.7*  PLT 92*    Chemistries   Recent Labs Lab 07/21/17 0355  NA 141  K 3.7  CL 116*  CO2 24  GLUCOSE 107*  BUN 16  CREATININE 0.88  CALCIUM 8.6*   Cardiac Enzymes No results for input(s): TROPONINI in the last 168 hours. RADIOLOGY:  Ct Hip Left Wo Contrast  Result Date: 07/20/2017 CLINICAL DATA:  Status post fall.  Left hip pain. EXAM: CT OF THE LEFT HIP WITHOUT CONTRAST TECHNIQUE: Multidetector CT imaging of the left hip was performed according to the standard protocol. Multiplanar CT image reconstructions were also generated. COMPARISON:  None. FINDINGS: Bones/Joint/Cartilage Impacted, nondisplaced left subcapital hip fracture. No other fracture or dislocation. Normal alignment. No joint effusion. No lytic or sclerotic osseous lesion. Ligaments Ligaments are suboptimally evaluated by CT. Muscles and Tendons Muscles are normal.  No muscle atrophy. Soft tissue No fluid collection or hematoma.  No soft tissue mass. IMPRESSION: Acute impacted, nondisplaced left subcapital hip fracture. Electronically Signed   By: Kathreen Devoid   On: 07/20/2017 14:05   Dg Chest Port 1 View  Result Date: 07/20/2017 CLINICAL DATA:  Known hip fracture. EXAM: PORTABLE CHEST 1 VIEW COMPARISON:  None. FINDINGS: The heart size and mediastinal contours are within normal limits. Both lungs are clear. The visualized skeletal structures are unremarkable. IMPRESSION:  No active disease. Electronically Signed   By: Dorise Bullion III M.D   On: 07/20/2017 13:03   Dg Hip Operative Unilat W Or W/o Pelvis Left  Result Date:  07/20/2017 CLINICAL DATA:  ORIF. EXAM: OPERATIVE LEFT HIP (WITH PELVIS IF PERFORMED)  VIEWS TECHNIQUE: Fluoroscopic spot image(s) were submitted for interpretation post-operatively. FLUOROSCOPY TIME:  1 minutes 54 seconds COMPARISON:  CT LEFT hip July 20, 2017 FINDINGS: Three fluoroscopic spot views of LEFT hip submitted, interpreting radiologist was not present at time of operation. 3 fully cannulated, partially threaded screws transfix LEFT femoral neck fracture. IMPRESSION: LEFT femur ORIF. Electronically Signed   By: Elon Alas M.D.   On: 07/20/2017 17:46   Dg Femur Min 2 Views Left  Result Date: 07/20/2017 CLINICAL DATA:  Fall, left hip pain EXAM: LEFT FEMUR 2 VIEWS COMPARISON:  None available FINDINGS: Patchy osteopenia. Cortical step-off and impaction present of the left subcapital femoral neck concerning for an impacted acute fracture. No Associated subluxation or dislocation. Visualized left hemipelvis intact. Distal aspect of the left femur appears intact. IMPRESSION: Findings concerning for a left subcapital impacted femoral neck fracture by plain radiography. Electronically Signed   By: Jerilynn Mages.  Shick M.D.   On: 07/20/2017 11:41   ASSESSMENT AND PLAN:  66 year old female with past medical history of glioblastoma status post craniotomy, currently undergoing chemoradiation, hyperlipidemia, endometriosis, history of mitral prolapse who presents to the hospital after a mechanical fall and noted to have a left hip fracture.  1. Status post fall and left hip fracture-patient is a low to moderate risk for noncardiac surgery. No absolute contraindication to surgery at this time. -POD #1 CANNULATED Left HIP PINNING by Dr Daine Gip  2. Chronic Thrombocytopenia-secondary to patient currently getting therapies given her history of glioblastoma. -Follow platelet count. No acute bleeding presently.  3. History of glioblastoma status post craniotomy-patient is extensively followed at [facility].  Currently undergoing chemotherapy. Per husband patient intermittently has confusion and she takes hydrocortisone given by [facility] oncology. We'll resume hydrocortisone 20 mg in the morning and 20 mg at supper time  4. Hyperlipidemia-continue Lipitor.  PT to be started Discussed with husband in the room. Case discussed with Care Management/Social Worker. Management plans discussed with the patient, family and they are in agreement.  CODE STATUS: FULL  DVT Prophylaxis: SCD/TEDS/Lovenox  TOTAL TIME TAKING CARE OF THIS PATIENT: *30* minutes.  >50% time spent on counselling and coordination of care  POSSIBLE D/C IN 1-2 DAYS, DEPENDING ON CLINICAL CONDITION.  Note: This dictation was prepared with Dragon dictation along with smaller phrase technology. Any transcriptional errors that result from this process are unintentional.  Zerrick Hanssen M.D on 07/21/2017 at 1:27 PM  Between 7am to 6pm - Pager - [phone]  After 6pm go to [email or web address] - password [email or web address] [place] [facility]  Office  [phone]  CC: Primary care physician; Idelle Crouch, MD

## 2017-07-21 NOTE — Care Management Note (Addendum)
Case Management Note  Patient Details  Name: Kelsey Woodard MRN: [id number] Date of Birth: 1951-03-31  Subjective/Objective:  Met with spouse, daughter and patient at bedside to discuss CIR. Explained CIR and what to expect.  Spouse and daughter declined CIR due to the fact that patient goes to [facility] for chemo every other Thursday, they also felt patient would be very unhappy at CIR. Daughter and spouse also report that this is to far away for them to drive. Spouse works but states he can provide 24/7 care for patient. He declines SNF and would like to try home health PT. Offered choice of home health agencies. Referral to [facility] for HHPT and SW. Will need a walker, ordered from [place] with [facility].    Action/Plan: [facility] for HHPT, and walker.   Expected Discharge Date:                  Expected Discharge Plan:  [place]  In-House Referral:     Discharge planning Services  CM Consult  Post Acute Care Choice:  Durable Medical Equipment, Home Health Choice offered to:  Spouse, Adult Children  DME Arranged:  Walker rolling DME Agency:  [place] Arranged:  PT, SW [place] Agency:  [place]  Status of Service:  In process, will continue to follow  If discussed at Long Length of Stay Meetings, dates discussed:    Additional Comments:  Jolly Mango, RN 07/21/2017, 1:28 PM

## 2017-07-21 NOTE — Progress Notes (Signed)
Rehab Admissions Coordinator Note:  Patient was screened by Cleatrice Burke for appropriateness for an Inpatient Acute Rehab Admission at [facility]. I spoke to pt's spouse by phone who is at pt's bedside. We discussed a possible inpt acute rehab admission. He states pt was Independent pta, but short term memory issues and higher level problem solving issues at baseline. Receives chemotherapy every other Thursday and next appointment is 07/24/17. At [facility]. He plans to call her Oncologist today to let them know of her injury to clarify if chemotherapy will still be scheduled for Thursday. I await OT evaluation and then will plan for bedside assessment at [facility] tomorrow to further evaluate pt for a possible inpt acute rehab admission pending insurance approval and bed availability when pt medically ready for d/c. I will alert RN CM and SW.  Cleatrice Burke 07/21/2017, 1:35 PM  I can be reached at [phone].

## 2017-07-21 NOTE — Progress Notes (Signed)
Pt is agitated and confused, oriented only to self, trying to get out of bed, pulled out IV twice and telemetry leads multiple times. PRN Fentanyl 29mcg given IV but with very minimal to no help. Dr. Jannifer Franklin paged and ordered for one time Haldol 2mg  IV. Telemetry discontinued per order. Will continue to monitor.

## 2017-07-21 NOTE — Progress Notes (Signed)
Physical Therapy Treatment Patient Details Name: Kelsey Woodard MRN: [id number] DOB: 01/29/1951 Today's Date: 07/21/2017    History of Present Illness presented to ER status post mechanical fall in home environment; admitted with L subcapital hip fracture, status post L cannulated hip pinning (07/20/17), WBAT per telephone clarification by Dr. Rudene Christians.  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    PT Comments    Pt agreeable to PT; pt/spouse note pt is still quite lethargic without much change from the morning. Pt rates l hi pain a 4/10 through faces scale. Pt requires increased instruction for all exercises and mobility tasks. Min A for transfers, mod assist for ambulation with heavy verbal and tactile cues for all mobility and significant increased time. Use of visual markers for pt to step to and maintain step for improved step lengths. Continue PT to progress strength, endurance, and balance mobility.   Follow Up Recommendations  CIR     Equipment Recommendations  Rolling walker with 5" wheels    Recommendations for Other Services       Precautions / Restrictions Precautions Precautions: Fall Restrictions Weight Bearing Restrictions: Yes Other Position/Activity Restrictions: WBAT  L LE    Mobility  Bed Mobility Overal bed mobility: Needs Assistance Bed Mobility: Sit to Supine     Supine to sit: Mod assist;Max assist Sit to supine: Min assist;+2 for physical assistance   General bed mobility comments: Assist for LEs and control trunk to bed  Transfers Overall transfer level: Needs assistance Equipment used: Rolling walker (2 wheeled) Transfers: Sit to/from Stand Sit to Stand: Min assist         General transfer comment: cues for hand placement with several transfers with heavy verbal and tactile cueing with need for physical placement at times. Decreased use of LLE  Ambulation/Gait Ambulation/Gait assistance: Mod  assist Ambulation Distance (Feet): 20 Feet Assistive device: Rolling walker (2 wheeled) Gait Pattern/deviations: Step-to pattern   Gait velocity interpretation: <1.8 ft/sec, indicative of risk for recurrent falls General Gait Details: maintains L knee flexion, WBing through ball of L foot only with gait efforts; unable to fully lift/advance R LE (due to limited weight acceptance L LE).  Tends to scoot/slide feet vs lift/step   Stairs            Wheelchair Mobility    Modified Rankin (Stroke Patients Only)       Balance Overall balance assessment: Needs assistance Sitting-balance support: Bilateral upper extremity supported Sitting balance-Leahy Scale: Good     Standing balance support: Bilateral upper extremity supported Standing balance-Leahy Scale: Poor                              Cognition Arousal/Alertness: Suspect due to medications;Lethargic Behavior During Therapy: WFL for tasks assessed/performed;Flat affect Overall Cognitive Status: Impaired/Different from baseline Area of Impairment: Following commands;Safety/judgement;Awareness;Problem solving;Memory;Attention                   Current Attention Level: Alternating Memory: Decreased recall of precautions;Decreased short-term memory Following Commands: Follows one step commands inconsistently Safety/Judgement: Decreased awareness of safety;Decreased awareness of deficits   Problem Solving: Slow processing General Comments: Difficutly following instruction completely and understanding process/sequence of taking steps. Poor awareness of task therapist trying to have pt accomplish.       Exercises General Exercises - Lower Extremity Quad Sets: Strengthening;Both;10 reps;Standing Long Arc Quad: AAROM;Left;20 reps;Seated (AROM R) Hip ABduction/ADduction: Strengthening;Left;5 reps;Standing  Straight Leg Raises: Strengthening;Left;10 reps;Standing Hip Flexion/Marching: AAROM;AROM;Both;20  reps;Seated (Assist L as needed) Toe Raises: AROM;Both;15 reps;Seated Heel Raises: AAROM;Both;15 reps;Seated Other Exercises Other Exercises: Supine LE therex, 1x8-10, act assist ROM: ankle pumps, SAQs, heel slides, hip abduct/adduct.  Max, step by step cuing for active participation, alertness with supine activities.    General Comments        Pertinent Vitals/Pain Pain Assessment: Faces Faces Pain Scale: Hurts little more Pain Location: L LE Pain Descriptors / Indicators: Grimacing;Guarding (difficulty/hesitancy placing weight through) Pain Intervention(s): Limited activity within patient's tolerance;Monitored during session;Repositioned    Home Living Family/patient expects to be discharged to:: Private residence Living Arrangements: Spouse/significant other Available Help at Discharge: Family Type of Home: House Home Access: Stairs to enter Entrance Stairs-Rails: Right Home Layout: One level   Additional Comments: Husband works outside of home, but is willing/able to stay with patient, coordinate 24/7 upon discharge as needed.    Prior Function Level of Independence: Independent      Comments: Indep with ADLs, household and community mobility without assist device.   PT Goals (current goals can now be found in the care plan section) Acute Rehab PT Goals Patient Stated Goal: to get moving again PT Goal Formulation: With patient/family Time For Goal Achievement: 08/04/17 Potential to Achieve Goals: Fair    Frequency    BID      PT Plan Current plan remains appropriate    Co-evaluation              AM-PAC PT "6 Clicks" Daily Activity  Outcome Measure  Difficulty turning over in bed (including adjusting bedclothes, sheets and blankets)?: Unable Difficulty moving from lying on back to sitting on the side of the bed? : Unable Difficulty sitting down on and standing up from a chair with arms (e.g., wheelchair, bedside commode, etc,.)?: Unable Help needed  moving to and from a bed to chair (including a wheelchair)?: A Lot Help needed walking in hospital room?: A Lot Help needed climbing 3-5 steps with a railing? : Total 6 Click Score: 8    End of Session Equipment Utilized During Treatment: Gait belt Activity Tolerance: Patient tolerated treatment well Patient left: in bed;with call bell/phone within reach;with family/visitor present Nurse Communication: Mobility status PT Visit Diagnosis: Difficulty in walking, not elsewhere classified (R26.2);Muscle weakness (generalized) (M62.81);Pain Pain - Right/Left: Left Pain - part of body: Hip     Time: 1610-9604 PT Time Calculation (min) (ACUTE ONLY): 35 min  Charges:  $Gait Training: 8-22 mins $Therapeutic Exercise: 8-22 mins $Therapeutic Activity: 8-22 mins                    G Codes:  Functional Assessment Tool Used: AM-PAC 6 Clicks Basic Mobility Functional Limitation: Mobility: Walking and moving around Mobility: Walking and Moving Around Current Status (V4098): At least 60 percent but less than 80 percent impaired, limited or restricted Mobility: Walking and Moving Around Goal Status [phone]): At least 20 percent but less than 40 percent impaired, limited or restricted     Larae Grooms, PTA 07/21/2017, 2:46 PM

## 2017-07-21 NOTE — Care Management (Signed)
Received call from [facility] with CIR that spouse has decided he would be interested in pursuing CIR. She will evaluate patient tomorrow.

## 2017-07-21 NOTE — Plan of Care (Signed)
Problem: Pain Managment: Goal: General experience of comfort will improve Outcome: Progressing Patient denies any pain thus far this shift.  Problem: Physical Regulation: Goal: Will remain free from infection Outcome: Progressing No new signs or symptoms of infection noted at this time  Problem: Activity: Goal: Risk for activity intolerance will decrease Outcome: Progressing Patient up out of bed to chair today with PT, family visiting at bedside. IV fluids stopped as patient tolerating diet and is beginning to eat and drink more. Patient denies any needs thus far this shift.   Problem: Fluid Volume: Goal: Ability to maintain a balanced intake and output will improve Outcome: Progressing Patient has urinated and has a bowel movement per report of the daughter this shift.   Problem: Nutrition: Goal: Adequate nutrition will be maintained Outcome: Progressing Intake increasing

## 2017-07-21 NOTE — Evaluation (Signed)
Physical Therapy Evaluation Patient Details Name: Kelsey Woodard MRN: [id number] DOB: 1951/06/12 Today's Date: 07/21/2017   History of Present Illness  presented to ER status post mechanical fall in home environment; admitted with L subcapital hip fracture, status post L cannulated hip pinning (07/20/17), WBAT per telephone clarification by Dr. Rudene Christians.  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  Clinical Impression  Upon evaluation, patient lethargic, but arousable to voice/light touch.  Generally confused, but follows simple commands with increased time/encouragement.  L LE strength grossly 3-/5, limited by post-op pain and soreness; requiring act assist/hand over hand to initiate movement at all joints, all planes.  Currently requiring mod/max assist for bed mobility, but maintains static sitting balance with close sup once upright and accommodated to position.  Completes sit/stand, basic transfers and very short-distance gait (5') with RW, mod assist for balance and movement sequencing. Unable to achieve full L foot flat, L TKE in closed-chain position; thus, significant difficulty with unweighting/advancing each LE.  Will continue gait efforts in subsequent sessions as appropriate. Family present and very supportive/encouraging throughout session.  Per husband, willing/able to provide 24/7 assist upon discharge as patient needs. Would benefit from skilled PT to address above deficits and promote optimal return to PLOF; recommend transition to acute inpatient rehab upon discharge for high-intensity, post-acute rehab services.      Follow Up Recommendations CIR    Equipment Recommendations  Rolling walker with 5" wheels    Recommendations for Other Services       Precautions / Restrictions Precautions Precautions: Fall Restrictions Weight Bearing Restrictions: Yes Other Position/Activity Restrictions: WBAT  L LE      Mobility  Bed  Mobility Overal bed mobility: Needs Assistance Bed Mobility: Supine to Sit     Supine to sit: Mod assist;Max assist     General bed mobility comments: assist to initiate movement, control LEs and trunk  Transfers Overall transfer level: Needs assistance Equipment used: Rolling walker (2 wheeled) Transfers: Sit to/from Stand Sit to Stand: Mod assist         General transfer comment: decreased active use of L LE, unable to achieve full L foot flat position/L knee extension with movement transition  Ambulation/Gait Ambulation/Gait assistance: Mod assist Ambulation Distance (Feet): 5 Feet Assistive device: Rolling walker (2 wheeled)       General Gait Details: maintains L knee flexion, WBing through ball of L foot only with gait efforts; unable to fully lift/advance R LE (due to limited weight acceptance L LE).  Tends to scoot/slide feet vs lift/step  Stairs            Wheelchair Mobility    Modified Rankin (Stroke Patients Only)       Balance Overall balance assessment: Needs assistance Sitting-balance support: No upper extremity supported;Feet supported Sitting balance-Leahy Scale: Good     Standing balance support: Bilateral upper extremity supported Standing balance-Leahy Scale: Poor                               Pertinent Vitals/Pain Pain Assessment: Faces Faces Pain Scale: Hurts little more Pain Location: L LE Pain Descriptors / Indicators: Aching;Grimacing;Guarding Pain Intervention(s): Limited activity within patient's tolerance;Monitored during session;Repositioned    Home Living Family/patient expects to be discharged to:: Private residence Living Arrangements: Spouse/significant other Available Help at Discharge: Family Type of Home: House Home Access: Stairs to enter Entrance Stairs-Rails: Right Entrance Stairs-Number of Steps: 2  Home Layout: One level   Additional Comments: Husband works outside of home, but is willing/able  to stay with patient, coordinate 24/7 upon discharge as needed.    Prior Function Level of Independence: Independent         Comments: Indep with ADLs, household and community mobility without assist device.     Hand Dominance        Extremity/Trunk Assessment   Upper Extremity Assessment Upper Extremity Assessment: Overall WFL for tasks assessed    Lower Extremity Assessment Lower Extremity Assessment: Generalized weakness (L LE grossly 3-/5, limited by post-op pain/weakness. Denies sensory deficit.  Constant, step by step cuing for participation with formal assessment)       Communication      Cognition Arousal/Alertness: Lethargic Behavior During Therapy: WFL for tasks assessed/performed;Flat affect Overall Cognitive Status: Difficult to assess                                 General Comments: notably confused, difficulty comprehending and initiating tasks      General Comments      Exercises Other Exercises Other Exercises: Supine LE therex, 1x8-10, act assist ROM: ankle pumps, SAQs, heel slides, hip abduct/adduct.  Max, step by step cuing for active participation, alertness with supine activities.   Assessment/Plan    PT Assessment Patient needs continued PT services  PT Problem List Decreased strength;Decreased range of motion;Decreased balance;Decreased activity tolerance;Decreased mobility;Decreased coordination;Decreased cognition;Decreased knowledge of use of DME;Decreased safety awareness;Decreased knowledge of precautions;Decreased skin integrity;Pain       PT Treatment Interventions DME instruction;Gait training;Stair training;Functional mobility training;Therapeutic activities;Therapeutic exercise;Balance training;Patient/family education    PT Goals (Current goals can be found in the Care Plan section)  Acute Rehab PT Goals Patient Stated Goal: to get moving again PT Goal Formulation: With patient/family Time For Goal Achievement:  08/04/17 Potential to Achieve Goals: Fair    Frequency BID   Barriers to discharge        Co-evaluation               AM-PAC PT "6 Clicks" Daily Activity  Outcome Measure Difficulty turning over in bed (including adjusting bedclothes, sheets and blankets)?: Unable Difficulty moving from lying on back to sitting on the side of the bed? : Unable Difficulty sitting down on and standing up from a chair with arms (e.g., wheelchair, bedside commode, etc,.)?: Unable Help needed moving to and from a bed to chair (including a wheelchair)?: A Lot Help needed walking in hospital room?: A Lot Help needed climbing 3-5 steps with a railing? : Total 6 Click Score: 8    End of Session Equipment Utilized During Treatment: Gait belt Activity Tolerance: Patient tolerated treatment well Patient left: with call bell/phone within reach;in chair;with chair alarm set;with family/visitor present Nurse Communication: Mobility status PT Visit Diagnosis: Difficulty in walking, not elsewhere classified (R26.2);Muscle weakness (generalized) (M62.81);Pain Pain - Right/Left: Left Pain - part of body: Hip    Time: 1035-1106 PT Time Calculation (min) (ACUTE ONLY): 31 min   Charges:   PT Evaluation $PT Eval Moderate Complexity: 1 Mod PT Treatments $Therapeutic Exercise: 8-22 mins $Therapeutic Activity: 8-22 mins   PT G Codes:   PT G-Codes **NOT FOR INPATIENT CLASS** Functional Assessment Tool Used: AM-PAC 6 Clicks Basic Mobility Functional Limitation: Mobility: Walking and moving around Mobility: Walking and Moving Around Current Status (H8299): At least 60 percent but less than 80 percent impaired, limited or restricted  Mobility: Walking and Moving Around Goal Status [phone]): At least 20 percent but less than 40 percent impaired, limited or restricted    Elda Dunkerson H. Owens Shark, PT, DPT, NCS 07/21/17, 11:34 AM [phone]

## 2017-07-21 NOTE — Progress Notes (Signed)
OT Cancellation Note  Patient Details Name: Kelsey Woodard MRN: [id number] DOB: September 20, 1951   Cancelled Treatment:    Reason Eval/Treat Not Completed: Other (comment). Order received, chart reviewed. Upon attempt, pt sleeping in bed, spouse in room. Educated pt/spouse in role of OT and need for OT evaluation as part of CIR assessment. Pt reported no pain at rest, just very fatigued, having recently worked with PT this afternoon. Spouse requesting OT come back next date in the morning to allow pt time to rest and for the effects of the surgery to subside more prior to working with her. Will re-attempt OT evaluation first thing in the morning.   Jeni Salles, MPH, MS, OTR/L ascom [phone] 07/21/17, 3:29 PM

## 2017-07-21 NOTE — Clinical Social Work Note (Signed)
Clinical Social Work Assessment  Patient Details  Name: Kelsey Woodard MRN: [id number] Date of Birth: 14-Apr-1951  Date of referral:  07/21/17               Reason for consult:  Facility Placement                Permission sought to share information with:    Permission granted to share information::     Name::        Agency::     Relationship::     Contact Information:     Housing/Transportation Living arrangements for the past 2 months:  Single Family Home Source of Information:  Patient, Adult Children, Spouse Patient Interpreter Needed:  None Criminal Activity/Legal Involvement Pertinent to Current Situation/Hospitalization:  No - Comment as needed Significant Relationships:  Adult Children, Spouse Lives with:  Spouse Do you feel safe going back to the place where you live?  Yes Need for family participation in patient care:  Yes (Comment)  Care giving concerns:  Patient lives in [place] with her husband Jan [phone].    Social Worker assessment / plan:  Holiday representative (CSW) received SNF consult. PT is recommending CIR. CSW met with patient and her daughter Earnest Nayla Dias was at bedside. Patient was oriented however was drifting to sleep during assessment. Per daughter patient lives in [place] with her husband and goes to [facility] twice a month for chemo treatment. Per daughter patient's husband wants to take patient home. CSW explained to patient and her daughter the difference between CIR, SNF and home health. Daughter and patient requested for CSW to call Jan. CSW contacted patient's husband and made him aware of above. Husband reported that patient will not be happy in [place] or in a SNF. Per husband he would like for patient to come home with home health and prefers [facility]. RN case manager aware of above. CSW will continue to follow and assist as needed.   Employment status:  Retired Nurse, adult PT Recommendations:  Inpatient  [facility] / Referral to community resources:  [place]  Patient/Family's Response to care:  Patient's husband Jan prefers home health.   Patient/Family's Understanding of and Emotional Response to Diagnosis, Current Treatment, and Prognosis:  Patient and her husband and daughter were very pleasant and thanked CSW for assistance.   Emotional Assessment Appearance:  Appears stated age Attitude/Demeanor/Rapport:    Affect (typically observed):  Accepting, Adaptable, Pleasant Orientation:  Oriented to Self, Oriented to Place, Oriented to  Time, Oriented to Situation Alcohol / Substance use:  Not Applicable Psych involvement (Current and /or in the community):  No (Comment)  Discharge Needs  Concerns to be addressed:  Discharge Planning Concerns Readmission within the last 30 days:  No Current discharge risk:  Dependent with Mobility Barriers to Discharge:  Continued Medical Work up   [employer], [name], LCSW 07/21/2017, 4:12 PM

## 2017-07-21 NOTE — Progress Notes (Signed)
Pt is restless, moaning and agitated. Pulled out IV again, armbands and dressing on the left hip. Staples intact with minimal drainage noted. Surgical site cleansed and redressed with Aquacel. Peripheral IV reinserted on the left AC. Pt oriented to self, stated she "doesnt know where she is" when asked and keeps on calling husband's name. PRN Fentanyl administered IV. Will continue to monitor.

## 2017-07-21 NOTE — Progress Notes (Signed)
  Subjective: 1 Day Post-Op Procedure(s) (LRB): CANNULATED HIP PINNING (Left) Patient reports pain as mild.   Patient is well but very drowsy during exam. PT and Care Management to assist with discharge planning. Negative for chest pain and shortness of breath Fever: 99.5 last night. Gastrointestinal:Negative for nausea and vomiting  Objective: Vital signs in last 24 hours: Temp:  [97.3 F (36.3 C)-99.5 F (37.5 C)] 99.5 F (37.5 C) (10/22 0916) Pulse Rate:  [67-118] 86 (10/22 0916) Resp:  [15-19] 18 (10/22 0916) BP: (115-170)/(62-100) 147/62 (10/22 0916) SpO2:  [92 %-100 %] 99 % (10/22 0916)  Intake/Output from previous day:  Intake/Output Summary (Last 24 hours) at 07/21/17 1158 Last data filed at 07/21/17 0900  Gross per 24 hour  Intake             1520 ml  Output              620 ml  Net              900 ml    Intake/Output this shift: Total I/O In: 240 [P.O.:240] Out: -   Labs:  Recent Labs  07/20/17 1243 07/21/17 0355  HGB 12.8 11.2*    Recent Labs  07/20/17 1243 07/21/17 0355  WBC 6.8 5.7  RBC 3.76* 3.36*  HCT 36.6 32.7*  PLT 94* 92*    Recent Labs  07/20/17 1243 07/21/17 0355  NA 135 141  K 3.6 3.7  CL 107 116*  CO2 24 24  BUN 23* 16  CREATININE 0.89 0.88  GLUCOSE 144* 107*  CALCIUM 9.5 8.6*   No results for input(s): LABPT, INR in the last 72 hours.   EXAM General - Patient is Appropriate, Lacking and falling asleep while answering questions today. Extremity - ABD soft Neurovascular intact Sensation intact distally Intact pulses distally Dorsiflexion/Plantar flexion intact Incision: dressing C/D/I No cellulitis present Dressing/Incision - clean, dry, no drainage Motor Function - intact, moving foot and toes well on exam.   Past Medical History:  Diagnosis Date  . Endometriosis   . Glioblastoma ([place])   . Hyperlipidemia   . Mitral valve prolapse     Assessment/Plan: 1 Day Post-Op Procedure(s) (LRB): CANNULATED HIP  PINNING (Left) Active Problems:   Closed left hip fracture (HCC)  Estimated body mass index is 19.33 kg/m as calculated from the following:   Height as of this encounter: 5' (1.524 m).   Weight as of this encounter: 44.9 kg (99 lb). Advance diet Up with therapy D/C IV fluids   Labs reviewed today, WBC 5.7, Hg 11.2 Pt is drowsy today, will decrease pain medication at this time. CBC and BMP ordered for tomorrow morning. Up with therapy today, according to OP note, flat-foot weightbearing.  DVT Prophylaxis - Lovenox, Foot Pumps and TED hose Partial weightbearing to left leg.  Raquel James, PA-C [facility] Orthopaedic [facility] 07/21/2017, 11:58 AM

## 2017-07-21 NOTE — Anesthesia Postprocedure Evaluation (Signed)
Anesthesia Post Note  Patient: Kelsey Woodard  Procedure(s) Performed: CANNULATED HIP PINNING (Left )  Patient location during evaluation: PACU Anesthesia Type: General Level of consciousness: awake and alert Pain management: pain level controlled Vital Signs Assessment: post-procedure vital signs reviewed and stable Respiratory status: spontaneous breathing, nonlabored ventilation, respiratory function stable and patient connected to nasal cannula oxygen Cardiovascular status: blood pressure returned to baseline and stable Postop Assessment: no apparent nausea or vomiting Anesthetic complications: no     Last Vitals:  Vitals:   07/21/17 0024 07/21/17 0345  BP: (!) 144/69 139/67  Pulse: 71 91  Resp: 16 18  Temp: 37.1 C 36.9 C  SpO2: 92% 96%    Last Pain:  Vitals:   07/20/17 2200  TempSrc:   PainSc: 0-No pain                 Tao Satz S

## 2017-07-21 NOTE — Progress Notes (Signed)
I have been notified by SW that pt's spouse wants to take pt home with [facility], not inpatient acute rehabilitation. I will sign off at this time. Please let me know if you have any questions. [phone]

## 2017-07-22 LAB — CBC
HCT: 31.4 % — ABNORMAL LOW (ref 35.0–47.0)
Hemoglobin: 10.8 g/dL — ABNORMAL LOW (ref 12.0–16.0)
MCH: 33.9 pg (ref 26.0–34.0)
MCHC: 34.6 g/dL (ref 32.0–36.0)
MCV: 97.9 fL (ref 80.0–100.0)
PLATELETS: 90 10*3/uL — AB (ref 150–440)
RBC: 3.2 MIL/uL — ABNORMAL LOW (ref 3.80–5.20)
RDW: 17 % — ABNORMAL HIGH (ref 11.5–14.5)
WBC: 4.3 10*3/uL (ref 3.6–11.0)

## 2017-07-22 LAB — BASIC METABOLIC PANEL
ANION GAP: 7 (ref 5–15)
BUN: 15 mg/dL (ref 6–20)
CALCIUM: 8.5 mg/dL — AB (ref 8.9–10.3)
CO2: 25 mmol/L (ref 22–32)
Chloride: 111 mmol/L (ref 101–111)
Creatinine, Ser: 0.88 mg/dL (ref 0.44–1.00)
Glucose, Bld: 91 mg/dL (ref 65–99)
Potassium: 3.2 mmol/L — ABNORMAL LOW (ref 3.5–5.1)
SODIUM: 143 mmol/L (ref 135–145)

## 2017-07-22 MED ORDER — TRAMADOL HCL 50 MG PO TABS: 50.0000 mg | ORAL_TABLET | Freq: Four times a day (QID) | ORAL | 0 refills | Status: DC | PRN

## 2017-07-22 MED ORDER — ENOXAPARIN SODIUM 30 MG/0.3ML ~~LOC~~ SOLN: 30.0000 mg | SUBCUTANEOUS | 0 refills | Status: DC

## 2017-07-22 MED ORDER — ENOXAPARIN SODIUM 40 MG/0.4ML ~~LOC~~ SOLN: 40.0000 mg | SUBCUTANEOUS | 0 refills | Status: DC

## 2017-07-22 MED ORDER — ENOXAPARIN SODIUM 30 MG/0.3ML ~~LOC~~ SOLN
30.0000 mg | SUBCUTANEOUS | Status: DC
  Administered 2017-07-22: 30 mg via SUBCUTANEOUS
  Filled 2017-07-22: qty 0.3

## 2017-07-22 NOTE — Progress Notes (Addendum)
   Subjective: 2 Days Post-Op Procedure(s) (LRB): CANNULATED HIP PINNING (Left) Patient reports pain as mild.   Patient is well, and has had no acute complaints or problems Denies any CP, SOB, ABD pain. We will continue therapy today.    Objective: Vital signs in last 24 hours: Temp:  [98.2 F (36.8 C)-99.5 F (37.5 C)] 98.2 F (36.8 C) (10/22 2009) Pulse Rate:  [79-86] 79 (10/22 2009) Resp:  [18-19] 19 (10/22 2009) BP: (146-166)/(62-83) 166/69 (10/22 2009) SpO2:  [95 %-99 %] 95 % (10/22 2009)  Intake/Output from previous day: 10/22 0701 - 10/23 0700 In: 240 [P.O.:240] Out: 110 [Urine:110] Intake/Output this shift: No intake/output data recorded.   Recent Labs  07/20/17 1243 07/21/17 0355 07/22/17 0458  HGB 12.8 11.2* 10.8*    Recent Labs  07/21/17 0355 07/22/17 0458  WBC 5.7 4.3  RBC 3.36* 3.20*  HCT 32.7* 31.4*  PLT 92* 90*    Recent Labs  07/21/17 0355 07/22/17 0458  NA 141 143  K 3.7 3.2*  CL 116* 111  CO2 24 25  BUN 16 15  CREATININE 0.88 0.88  GLUCOSE 107* 91  CALCIUM 8.6* 8.5*   No results for input(s): LABPT, INR in the last 72 hours.  EXAM General - Patient is Alert, Appropriate and Oriented Extremity - Neurovascular intact Sensation intact distally Intact pulses distally Dorsiflexion/Plantar flexion intact No cellulitis present Compartment soft Dressing - dressing C/D/I and scant drainage Motor Function - intact, moving foot and toes well on exam.   Past Medical History:  Diagnosis Date  . Endometriosis   . Glioblastoma ([place])   . Hyperlipidemia   . Mitral valve prolapse     Assessment/Plan:   2 Days Post-Op Procedure(s) (LRB): CANNULATED HIP PINNING (Left) Active Problems:   Closed left hip fracture (HCC)  Estimated body mass index is 19.33 kg/m as calculated from the following:   Height as of this encounter: 5' (1.524 m).   Weight as of this encounter: 44.9 kg (99 lb). Advance diet Up with therapy  Needs BM Acute  post op blood loss anemia - Hgb stable 10.8 Discharge pending medical clearance Follow up with [facility] in 2 weeks for recheck Lovenox 30 mg SQ daily x 14 days   DVT Prophylaxis - Lovenox, Foot Pumps and TED hose Weight-Bearing as tolerated to left leg   T. Rachelle Hora, PA-C [place] 07/22/2017, 7:19 AM

## 2017-07-22 NOTE — Progress Notes (Signed)
[facility] [place] [facility] [place] NAME: Kelsey Woodard    MR#:  [id number]  DATE OF BIRTH:  26-May-1951  SUBJECTIVE:   Came in after having mechanical fall at home. Husband in the room.  Patient more alert and clear today.  Doing well with physical therapy.  REVIEW OF SYSTEMS:   Review of Systems  Constitutional: Negative for chills, fever and weight loss.  HENT: Negative for ear discharge, ear pain and nosebleeds.   Eyes: Negative for blurred vision, pain and discharge.  Respiratory: Negative for sputum production, shortness of breath, wheezing and stridor.   Cardiovascular: Negative for chest pain, palpitations, orthopnea and PND.  Gastrointestinal: Negative for abdominal pain, diarrhea, nausea and vomiting.  Genitourinary: Negative for frequency and urgency.  Musculoskeletal: Positive for joint pain. Negative for back pain.  Neurological: Positive for weakness. Negative for sensory change, speech change and focal weakness.  Psychiatric/Behavioral: Negative for depression and hallucinations. The patient is not nervous/anxious.    Tolerating Diet:yes Tolerating PT: Recommends inpatient rehab--- husband wants to take patient home DRUG ALLERGIES:  No Known Allergies  VITALS:  Blood pressure (!) 160/76, pulse 81, temperature 97.6 F (36.4 C), temperature source Oral, resp. rate 19, height 5' (1.524 m), weight 44.9 kg (99 lb), SpO2 97 %.  PHYSICAL EXAMINATION:   Physical Exam  GENERAL:  67 y.o.-year-old patient lying in the bed with no acute distress.  EYES: Pupils equal, round, reactive to light and accommodation. No scleral icterus. Extraocular muscles intact.  HEENT: Head atraumatic, normocephalic. Oropharynx and nasopharynx clear.  NECK:  Supple, no jugular venous distention. No thyroid enlargement, no tenderness.  LUNGS: Normal breath sounds bilaterally, no wheezing, rales, rhonchi. No use of accessory muscles of respiration.   CARDIOVASCULAR: S1, S2 normal. No murmurs, rubs, or gallops.  ABDOMEN: Soft, nontender, nondistended. Bowel sounds present. No organomegaly or mass.  EXTREMITIES: No cyanosis, clubbing or edema b/l.    NEUROLOGIC: Cranial nerves II through XII are intact. No focal Motor or sensory deficits b/l.   PSYCHIATRIC:  patient is alert but sleepy SKIN: No obvious rash, lesion, or ulcer.   LABORATORY PANEL:  CBC  Recent Labs Lab 07/22/17 0458  WBC 4.3  HGB 10.8*  HCT 31.4*  PLT 90*    Chemistries   Recent Labs Lab 07/22/17 0458  NA 143  K 3.2*  CL 111  CO2 25  GLUCOSE 91  BUN 15  CREATININE 0.88  CALCIUM 8.5*   Cardiac Enzymes No results for input(s): TROPONINI in the last 168 hours. RADIOLOGY:  Dg Hip Operative Unilat W Or W/o Pelvis Left  Result Date: 07/20/2017 CLINICAL DATA:  ORIF. EXAM: OPERATIVE LEFT HIP (WITH PELVIS IF PERFORMED)  VIEWS TECHNIQUE: Fluoroscopic spot image(s) were submitted for interpretation post-operatively. FLUOROSCOPY TIME:  1 minutes 54 seconds COMPARISON:  CT LEFT hip July 20, 2017 FINDINGS: Three fluoroscopic spot views of LEFT hip submitted, interpreting radiologist was not present at time of operation. 3 fully cannulated, partially threaded screws transfix LEFT femoral neck fracture. IMPRESSION: LEFT femur ORIF. Electronically Signed   By: Elon Alas M.D.   On: 07/20/2017 17:46   ASSESSMENT AND PLAN:  66 year old female with past medical history of glioblastoma status post craniotomy, currently undergoing chemoradiation, hyperlipidemia, endometriosis, history of mitral prolapse who presents to the hospital after a mechanical fall and noted to have a left hip fracture.  1. Status post fall and left hip fracture-patient is a low to moderate risk for noncardiac surgery. No  absolute contraindication to surgery at this time. -POD # 2CANNULATED Left HIP PINNING by Dr Daine Gip  2. Chronic Thrombocytopenia-secondary to patient currently getting  therapies given her history of glioblastoma. - No acute bleeding presently.  3. History of glioblastoma status post craniotomy-patient is extensively followed at [facility]. Currently undergoing chemotherapy. Per husband patient intermittently has confusion and she takes hydrocortisone given by [facility] oncology. We'll resume hydrocortisone 20 mg in the morning and 20 mg at supper time  4. Hyperlipidemia-continue Lipitor.  PT to be started Discussed with husband in the room.  We will continue physical therapy in-house.  Discharge in a.m. if continues to improve. Case discussed with Care Management/Social Worker. Management plans discussed with the patient, family and they are in agreement.  CODE STATUS: FULL  DVT Prophylaxis: SCD/TEDS/Lovenox  TOTAL TIME TAKING CARE OF THIS PATIENT: *25 minutes.  >50% time spent on counselling and coordination of care  POSSIBLE D/C IN 1-2 DAYS, DEPENDING ON CLINICAL CONDITION.  Note: This dictation was prepared with Dragon dictation along with smaller phrase technology. Any transcriptional errors that result from this process are unintentional.  Denim Kalmbach M.D on 07/22/2017 at 2:04 PM  Between 7am to 6pm - Pager - [phone]  After 6pm go to [email or web address] - password [email or web address] [facility]  Office  [phone]  CC: Primary care physician; Idelle Crouch, MD

## 2017-07-22 NOTE — Discharge Instructions (Signed)

## 2017-07-22 NOTE — Progress Notes (Signed)
Physical Therapy Treatment Patient Details Name: Kelsey Woodard MRN: [id number] DOB: 1950-11-16 Today's Date: 07/22/2017    History of Present Illness presented to ER status post mechanical fall in home environment; admitted with L subcapital hip fracture, status post L cannulated hip pinning (07/20/17), WBAT per telephone clarification by Dr. Rudene Christians.  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    PT Comments    Pt in chair upon arrival.  Generally lethargic with difficulty answering questions appropriately.   Participated in exercises as described below.  Session focused on gait skills.  Standing with min a x 1.  On first attempt she was able to ambulate with 10' with mod assist and very narrow BOS and unable to complete a proper step-to gait.  After short rest due to fatigue she was able to try again.  Initially with similar gait but as she progressed into hallway, gait improved slightly with increased speed, more regular step-through gait pattern.  BOS remained very narrow despite verbal and tactile cues.  Husband assisted with recliner follow and encouragement.    Pt continues to require moderate assistance with mobility skills but is progressing overall.  Husband continues to want to take pt directly home upon discharge.  He will benefit from training on proper guarding and movement techniques to increase their success upon discharge.  She does have 4 steps into home with one handrail that need to be addressed prior to discharge.  He stated he does not have but has access to a wheelchair if needed at home.   Follow Up Recommendations  CIR     Equipment Recommendations  Rolling walker with 5" wheels    Recommendations for Other Services       Precautions / Restrictions Precautions Precautions: Fall Restrictions Weight Bearing Restrictions: Yes Other Position/Activity Restrictions: WBAT  L LE    Mobility  Bed Mobility                   Transfers Overall transfer level: Needs assistance Equipment used: Rolling walker (2 wheeled) Transfers: Sit to/from Stand Sit to Stand: Min assist         General transfer comment: cues for hand placement with several transfers with heavy verbal and tactile cueing with need for physical placement at times. Decreased use of LLE  Ambulation/Gait Ambulation/Gait assistance: Mod assist Ambulation Distance (Feet): 60 Feet Assistive device: Rolling walker (2 wheeled) Gait Pattern/deviations: Step-to pattern;Decreased step length - right;Decreased step length - left;Narrow base of support   Gait velocity interpretation: <1.8 ft/sec, indicative of risk for recurrent falls General Gait Details: improved weight bearing this session.  continues to scoot/slide foot vs lift/step   Stairs            Wheelchair Mobility    Modified Rankin (Stroke Patients Only)       Balance Overall balance assessment: Needs assistance Sitting-balance support: Bilateral upper extremity supported Sitting balance-Leahy Scale: Fair Sitting balance - Comments: Initially required min gaurd/assist due to cognition but progressed during session to supervision.   Standing balance support: Bilateral upper extremity supported Standing balance-Leahy Scale: Poor                              Cognition Arousal/Alertness: Suspect due to medications;Lethargic Behavior During Therapy: WFL for tasks assessed/performed;Flat affect Overall Cognitive Status: Impaired/Different from baseline Area of Impairment: Following commands;Safety/judgement;Awareness;Problem solving;Memory;Attention  General Comments: lethargic and difficulty answering questions at start of session but more alert and engaged at end of session.      Exercises General Exercises - Lower Extremity Quad Sets: Strengthening;Both;10 reps;Standing Long Arc Quad: AAROM;20  reps;Seated;Both Hip ABduction/ADduction: Supine;Both;10 reps;AAROM Straight Leg Raises: AAROM;Supine;Both;10 reps    General Comments        Pertinent Vitals/Pain Pain Assessment: 0-10 Pain Score: 7  Pain Location: L LE Pain Descriptors / Indicators: Grimacing;Guarding Pain Intervention(s): Limited activity within patient's tolerance    Home Living                      Prior Function            PT Goals (current goals can now be found in the care plan section) Progress towards PT goals: Progressing toward goals    Frequency    BID      PT Plan Current plan remains appropriate    Co-evaluation              AM-PAC PT "6 Clicks" Daily Activity  Outcome Measure  Difficulty turning over in bed (including adjusting bedclothes, sheets and blankets)?: Unable Difficulty moving from lying on back to sitting on the side of the bed? : Unable Difficulty sitting down on and standing up from a chair with arms (e.g., wheelchair, bedside commode, etc,.)?: Unable Help needed moving to and from a bed to chair (including a wheelchair)?: A Lot Help needed walking in hospital room?: A Lot Help needed climbing 3-5 steps with a railing? : Total 6 Click Score: 8    End of Session Equipment Utilized During Treatment: Gait belt Activity Tolerance: Patient tolerated treatment well Patient left: in chair;with chair alarm set;with call bell/phone within reach;with family/visitor present Nurse Communication: Mobility status Pain - Right/Left: Left Pain - part of body: Hip     Time: 3335-4562 PT Time Calculation (min) (ACUTE ONLY): 21 min  Charges:  $Gait Training: 8-22 mins                    G Codes:       Chesley Noon, PTA 07/22/17, 10:29 AM

## 2017-07-22 NOTE — Evaluation (Signed)
Occupational Therapy Evaluation Patient Details Name: Kelsey Woodard MRN: [id number] DOB: 1951/02/08 Today's Date: 07/22/2017    History of Present Illness presented to ER status post mechanical fall in home environment; admitted with L subcapital hip fracture, status post L cannulated hip pinning (07/20/17), WBAT per telephone clarification by Dr. Rudene Christians.  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   Clinical Impression   Pt is 66 year old female s/p L cannulated hip pinning who lives at home with her husband. Pt was generally independent in basic ADLs prior to surgery and is eager to return to PLOF.  Spouse was providing assist with medication management and driving, since pt had several seizures back in September, per spouse report. Pt is currently limited in functional ADLs due to pain, decreased ROM, and impaired cognition. Pt with hx of glioblastoma with surgery back in March. Pt had difficulty with sequencing and motor planning requiring moderate to maximal verbal/tactile/visual cues to ambulate <5 ft with RW and min guard to min assist for balance. Pt oriented to self and day of the week, required cues for orientation to situation (reported being at the hospital following her brain surgery). Pt/spouse educated in DME/AE for ADL tasks to maximize safety and functional independence.  Pt would benefit from continued skilled OT services for education/training in cognitive and compensatory strategies, education in assistive devices, functional mobility, and education in recommendations for home modifications to increase safety and prevent falls. Per chart review, pt/spouse no longer want to consider acute inpatient rehab. Pt would benefit from CIR. Recommend transition to STR following hospitalization in order to maximize return to PLOF.      Follow Up Recommendations  SNF    Equipment Recommendations  3 in 1 bedside commode;Tub/shower bench;Other  (comment) (consider reacher)    Recommendations for Other Services       Precautions / Restrictions Precautions Precautions: Fall Restrictions Weight Bearing Restrictions: Yes LLE Weight Bearing: Weight bearing as tolerated Other Position/Activity Restrictions: WBAT  L LE      Mobility Bed Mobility               General bed mobility comments: deferred, up in recliner  Transfers Overall transfer level: Needs assistance Equipment used: Rolling walker (2 wheeled) Transfers: Sit to/from Stand Sit to Stand: Min assist         General transfer comment: moderate verbal/visual/tactile cues for sequencing for BUE and BLE placement    Balance Overall balance assessment: Needs assistance Sitting-balance support: Bilateral upper extremity supported Sitting balance-Leahy Scale: Fair Sitting balance - Comments: Initially required min gaurd/assist due to cognition but progressed during session to supervision.   Standing balance support: Bilateral upper extremity supported Standing balance-Leahy Scale: Poor Standing balance comment: verbal and tactile cues to adjust posture/stance to improve balance                           ADL either performed or assessed with clinical judgement   ADL Overall ADL's : Needs assistance/impaired Eating/Feeding: Sitting;Set up   Grooming: Sitting;Set up   Upper Body Bathing: Sitting;Set up;Supervision/ safety   Lower Body Bathing: Sitting/lateral leans;Moderate assistance;Cueing for sequencing   Upper Body Dressing : Sitting;Set up;Min guard   Lower Body Dressing: Sitting/lateral leans;Sit to/from stand;Moderate assistance;Cueing for sequencing   Toilet Transfer: RW;Stand-pivot;BSC           Functional mobility during ADLs: Min guard;Minimal assistance;Cueing for sequencing;Cueing for safety;Rolling walker  Vision Baseline Vision/History: Wears glasses Wears Glasses: At all times Patient Visual Report: No  change from baseline Vision Assessment?: No apparent visual deficits     Perception     Praxis      Pertinent Vitals/Pain Pain Assessment: 0-10 Pain Score: 4  Pain Location: initial difficulty verbaling pain levels, then with spouse's help able to indicate "medium amount of pain" around a 4/10 Pain Descriptors / Indicators: Grimacing;Guarding Pain Intervention(s): Limited activity within patient's tolerance;Monitored during session;Repositioned     Hand Dominance Right   Extremity/Trunk Assessment Upper Extremity Assessment Upper Extremity Assessment: Overall WFL for tasks assessed   Lower Extremity Assessment Lower Extremity Assessment: Defer to PT evaluation;Generalized weakness (anticipated post-op weakness, required max verbal and moderate visual and tactile cues for taking steps with BLE)   Cervical / Trunk Assessment Cervical / Trunk Assessment: Normal   Communication Communication Communication: No difficulties   Cognition Arousal/Alertness: Awake/alert Behavior During Therapy: WFL for tasks assessed/performed Overall Cognitive Status: Impaired/Different from baseline Area of Impairment: Orientation                 Orientation Level: Disoriented to;Situation;Time Current Attention Level: Alternating Memory: Decreased recall of precautions;Decreased short-term memory Following Commands: Follows one step commands inconsistently Safety/Judgement: Decreased awareness of safety;Decreased awareness of deficits   Problem Solving: Slow processing;Requires tactile cues;Requires verbal cues;Difficulty sequencing General Comments: lethargic and difficulty answering questions at start of session but more alert and engaged at end of session.   General Comments       Exercises Other Exercises Other Exercises: pt/spouse educated in DME/AE to support functional independence with bathing, dressing, and toileting tasks. Pt/spouse verbalized understanding.   Shoulder  Instructions      Home Living Family/patient expects to be discharged to:: Private residence Living Arrangements: Spouse/significant other Available Help at Discharge: Family Type of Home: House Home Access: Stairs to enter Technical brewer of Steps: 2 Entrance Stairs-Rails: Right Home Layout: One level     Bathroom Shower/Tub: Corporate investment banker: [place]: None   Additional Comments: Husband works outside of home, but is willing/able to stay with patient, coordinate 24/7 upon discharge as needed.      Prior Functioning/Environment Level of Independence: Independent        Comments: Indep with ADLs, household and community mobility without assist device. Stopped driving after seizures in September. Spouse assists with medication mgt. 1 fall in past 12 months         OT Problem List: Decreased strength;Decreased coordination;Decreased cognition;Decreased safety awareness;Decreased activity tolerance;Impaired balance (sitting and/or standing);Decreased knowledge of use of DME or AE;Decreased knowledge of precautions      OT Treatment/Interventions: Self-care/ADL training;Therapeutic exercise;Neuromuscular education;Balance training;DME and/or AE instruction;Patient/family education;Cognitive remediation/compensation    OT Goals(Current goals can be found in the care plan section) Acute Rehab OT Goals Patient Stated Goal: walk better and go home OT Goal Formulation: With patient/family Time For Goal Achievement: 08/05/17 Potential to Achieve Goals: Fair  OT Frequency: Min 2X/week   Barriers to D/C: Inaccessible home environment          Co-evaluation              AM-PAC PT "6 Clicks" Daily Activity     Outcome Measure Help from another person eating meals?: A Little Help from another person taking care of personal grooming?: A Little Help from another person toileting, which includes using toliet, bedpan, or  urinal?: A Lot Help from another person bathing (  including washing, rinsing, drying)?: A Lot Help from another person to put on and taking off regular upper body clothing?: A Little Help from another person to put on and taking off regular lower body clothing?: A Lot 6 Click Score: 15   End of Session Equipment Utilized During Treatment: Gait belt;Rolling walker  Activity Tolerance: Patient tolerated treatment well Patient left: in chair;with call bell/phone within reach;with chair alarm set;with family/visitor present  OT Visit Diagnosis: Other abnormalities of gait and mobility (R26.89);Other symptoms and signs involving cognitive function;Muscle weakness (generalized) (M62.81)                Time: 7353-2992 OT Time Calculation (min): 29 min Charges:  OT General Charges $OT Visit: 1 Visit OT Evaluation $OT Eval Moderate Complexity: 1 Mod OT Treatments $Self Care/Home Management : 8-22 mins G-Codes: OT G-codes **NOT FOR INPATIENT CLASS** Functional Assessment Tool Used: AM-PAC 6 Clicks Daily Activity;Clinical judgement Functional Limitation: Self care Self Care Current Status (E2683): At least 40 percent but less than 60 percent impaired, limited or restricted Self Care Goal Status (M1962): At least 20 percent but less than 40 percent impaired, limited or restricted   Jeni Salles, MPH, MS, OTR/L ascom [phone] 07/22/17, 11:09 AM

## 2017-07-22 NOTE — Progress Notes (Signed)
Physical Therapy Treatment Patient Details Name: Kelsey Woodard MRN: [id number] DOB: June 30, 1951 Today's Date: 07/22/2017    History of Present Illness presented to ER status post mechanical fall in home environment; admitted with L subcapital hip fracture, status post L cannulated hip pinning (07/20/17), WBAT per telephone clarification by Dr. Rudene Christians.  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    PT Comments    Pt agreeable to PT; reports minimal pain through faces scale (2/10). Pt demonstrating improvement with all areas of functional mobility. Pt does require min A for STS transfers for hand placement, use of Left lower extremity and proper chair approach for sitting. Ambulation much improved with ability to advance each leg without tactile cues or targets; step to pattern primarily. Pt progress distance to 40 ft. Continue PT to progress strength, endurance and quality of gait to improve all functional mobility.    Follow Up Recommendations        Equipment Recommendations       Recommendations for Other Services       Precautions / Restrictions Precautions Precautions: Fall Restrictions Weight Bearing Restrictions: Yes LLE Weight Bearing: Weight bearing as tolerated Other Position/Activity Restrictions: WBAT  L LE    Mobility  Bed Mobility               General bed mobility comments: Not tested; up in chair and wished to remain in chair  Transfers Overall transfer level: Needs assistance Equipment used: Rolling walker (2 wheeled) Transfers: Sit to/from Stand Sit to Stand: Min assist         General transfer comment: Verbal cues for hand placement and improved use of LLE (for flat foot)  Ambulation/Gait Ambulation/Gait assistance: Min guard Ambulation Distance (Feet): 40 Feet Assistive device: Rolling walker (2 wheeled) Gait Pattern/deviations: Step-to pattern;Step-through pattern;Decreased stride length;Decreased  dorsiflexion - right;Decreased dorsiflexion - left;Decreased weight shift to left;Narrow base of support   Gait velocity interpretation: Below normal speed for age/gender General Gait Details: Partial step through; more consistently step through   [employer] Mobility    Modified Rankin (Stroke Patients Only)       Balance Overall balance assessment: Needs assistance Sitting-balance support: Feet supported;Bilateral upper extremity supported Sitting balance-Leahy Scale: Good     Standing balance support: Bilateral upper extremity supported Standing balance-Leahy Scale: Fair                              Cognition Arousal/Alertness: Awake/alert Behavior During Therapy: WFL for tasks assessed/performed Overall Cognitive Status: Impaired/Different from baseline                     Current Attention Level: Focused Memory: Decreased recall of precautions;Decreased short-term memory Following Commands: Follows one step commands inconsistently Safety/Judgement: Decreased awareness of safety;Decreased awareness of deficits   Problem Solving: Requires verbal cues General Comments: Improved from yesterdays session regarding following commands and follow through      Exercises General Exercises - Lower Extremity Ankle Circles/Pumps: AROM;Both;20 reps Quad Sets: Strengthening;Both;20 reps Gluteal Sets: Strengthening;Both;20 reps Long Arc Quad: AROM;Both;20 reps;Seated Heel Slides: AROM;Both;10 reps Hip ABduction/ADduction: AAROM;Both;10 reps Hip Flexion/Marching: AROM;Both;20 reps;Seated Toe Raises: AROM;Both;20 reps;Seated Heel Raises: AROM;Both;20 reps;Seated    General Comments        Pertinent Vitals/Pain Pain Assessment: Faces Faces Pain Scale: Hurts a little bit Pain Location: LLE Pain Intervention(s):  Monitored during session    Home Living                      Prior Function            PT Goals (current  goals can now be found in the care plan section) Progress towards PT goals: Progressing toward goals    Frequency    BID      PT Plan Current plan remains appropriate    Co-evaluation              AM-PAC PT "6 Clicks" Daily Activity  Outcome Measure  Difficulty turning over in bed (including adjusting bedclothes, sheets and blankets)?: Unable Difficulty moving from lying on back to sitting on the side of the bed? : Unable Difficulty sitting down on and standing up from a chair with arms (e.g., wheelchair, bedside commode, etc,.)?: Unable Help needed moving to and from a bed to chair (including a wheelchair)?: A Lot Help needed walking in hospital room?: A Little Help needed climbing 3-5 steps with a railing? : A Lot 6 Click Score: 10    End of Session Equipment Utilized During Treatment: Gait belt Activity Tolerance: Patient tolerated treatment well Patient left: in chair;with chair alarm set;with call bell/phone within reach;with family/visitor present   PT Visit Diagnosis: Difficulty in walking, not elsewhere classified (R26.2);Muscle weakness (generalized) (M62.81);Pain Pain - Right/Left: Left Pain - part of body: Hip     Time: 3419-3790 PT Time Calculation (min) (ACUTE ONLY): 31 min  Charges:  $Gait Training: 8-22 mins $Therapeutic Exercise: 8-22 mins                    G Codes:        Larae Grooms, PTA 07/22/2017, 4:17 PM

## 2017-07-22 NOTE — Progress Notes (Signed)
Patient ordered Lovenox 40 mg daily for DVT prophylaxis.   Filed Weights   07/20/17 1010  Weight: 99 lb (44.9 kg)   Estimated Creatinine Clearance: 44 mL/min (by C-G formula based on SCr of 0.88 mg/dL).  Will decrease Lovenox dosing to 30 mg daily for weight of 44.9 kg.   Ulice Dash, PharmD Clinical Pharmacist

## 2017-07-23 LAB — TYPE AND SCREEN
ABO/RH(D): A POS
Antibody Screen: POSITIVE
PT AG TYPE: NEGATIVE
UNIT DIVISION: 0
Unit division: 0

## 2017-07-23 LAB — BPAM RBC
BLOOD PRODUCT EXPIRATION DATE: 201811152359
Blood Product Expiration Date: 201811152359
UNIT TYPE AND RH: 5100
Unit Type and Rh: 5100

## 2017-07-23 NOTE — Care Management Important Message (Signed)
Important Message  Patient Details  Name: Kelsey Woodard MRN: [id number] Date of Birth: 1951/08/03   Medicare Important Message Given:  Yes    Jolly Mango, RN 07/23/2017, 8:46 AM

## 2017-07-23 NOTE — Discharge Summary (Signed)
[facility] [place] NAME: Kelsey Woodard    MR#:  [id number]  DATE OF BIRTH:  24-Nov-1950  DATE OF ADMISSION:  07/20/2017 ADMITTING PHYSICIAN: Henreitta Leber, MD  DATE OF DISCHARGE: 07/23/2017  PRIMARY CARE PHYSICIAN: Idelle Crouch, MD    ADMISSION DIAGNOSIS:  Hip fracture, left ([place]) [S72.002A] Subcapital fracture of hip, left, closed, initial encounter ([place]) [S72.012A]  DISCHARGE DIAGNOSIS:  Left Hip fracture-mechanical s/p pinning  SECONDARY DIAGNOSIS:   Past Medical History:  Diagnosis Date  . Endometriosis   . Glioblastoma ([place])   . Hyperlipidemia   . Mitral valve prolapse     HOSPITAL COURSE:   66 year old female with past medical history of glioblastoma status post craniotomy, currently undergoing chemoradiation, hyperlipidemia, endometriosis, history of mitral prolapse who presents to the hospital after a mechanical fall and noted to have a left hip fracture.  1. Status post fall and left hip fracture-patient is a low to moderate risk for noncardiac surgery. No absolute contraindication to surgery at this time. -POD # 3CANNULATED Left HIP PINNING by Dr Daine Gip  2. Chronic Thrombocytopenia-secondary to patient currently getting therapies given her history of glioblastoma. - No acute bleeding presently.  3. History of glioblastoma status post craniotomy-patient is extensively followed at [facility]. Currently undergoing chemotherapy. Per husband patient intermittently has confusion and she takes hydrocortisone given by [facility] oncology. We'll resume hydrocortisone 20 mg in the morning and 20 mg at supper time  4. Hyperlipidemia-continue Lipitor.  PT recommends rehab husband would like to take pt home with HHPT Discussed with husband in the room  D/c today CONSULTS OBTAINED:  Treatment Team:  Hessie Knows, MD Claud Kelp, MD  DRUG ALLERGIES:  No Known Allergies  DISCHARGE MEDICATIONS:   Current Discharge  Medication List    START taking these medications   Details  enoxaparin (LOVENOX) 30 MG/0.3ML injection Inject 0.3 mLs (30 mg total) into the skin daily. Qty: 14 Syringe, Refills: 0    traMADol (ULTRAM) 50 MG tablet Take 1 tablet (50 mg total) by mouth every 6 (six) hours as needed for moderate pain. Qty: 30 tablet, Refills: 0      CONTINUE these medications which have NOT CHANGED   Details  atorvastatin (LIPITOR) 20 MG tablet Take 0.5 tablets (10 mg total) by mouth daily. Qty: 90 tablet, Refills: 3   Associated Diagnoses: Hyperlipidemia LDL goal < 100    b complex vitamins tablet Take 1 tablet by mouth daily.    cetirizine (ZYRTEC) 10 MG tablet TAKE 1 TABLET (10 MG TOTAL) BY MOUTH DAILY. Qty: 30 tablet, Refills: 2    conjugated estrogens (PREMARIN) vaginal cream Place 5.05 Applicatorfuls vaginally 2 (two) times a week. Or as directed Qty: 42.5 g, Refills: 6   Associated Diagnoses: Atrophic vaginitis    fluticasone (FLONASE) 50 MCG/ACT nasal spray Place 1-2 sprays into both nostrils 2 (two) times daily.        If you experience worsening of your admission symptoms, develop shortness of breath, life threatening emergency, suicidal or homicidal thoughts you must seek medical attention immediately by calling 911 or calling your MD immediately  if symptoms less severe.  You Must read complete instructions/literature along with all the possible adverse reactions/side effects for all the Medicines you take and that have been prescribed to you. Take any new Medicines after you have completely understood and accept all the possible adverse reactions/side effects.   Please note  You were cared for by a  hospitalist during your hospital stay. If you have any questions about your discharge medications or the care you received while you were in the hospital after you are discharged, you can call the unit and asked to speak with the hospitalist on call if the hospitalist that took care of you  is not available. Once you are discharged, your primary care physician will handle any further medical issues. Please note that NO REFILLS for any discharge medications will be authorized once you are discharged, as it is imperative that you return to your primary care physician (or establish a relationship with a primary care physician if you do not have one) for your aftercare needs so that they can reassess your need for medications and monitor your lab values. Today   SUBJECTIVE   Doing well  VITAL SIGNS:  Blood pressure (!) 154/74, pulse 76, temperature 98.5 F (36.9 C), temperature source Oral, resp. rate 18, height 5' (1.524 m), weight 44.9 kg (99 lb), SpO2 100 %.  I/O:   Intake/Output Summary (Last 24 hours) at 07/23/17 0748 Last data filed at 07/22/17 1300  Gross per 24 hour  Intake              600 ml  Output                0 ml  Net              600 ml    PHYSICAL EXAMINATION:  GENERAL:  66 y.o.-year-old patient lying in the bed with no acute distress.  EYES: Pupils equal, round, reactive to light and accommodation. No scleral icterus. Extraocular muscles intact.  HEENT: Head atraumatic, normocephalic. Oropharynx and nasopharynx clear.  NECK:  Supple, no jugular venous distention. No thyroid enlargement, no tenderness.  LUNGS: Normal breath sounds bilaterally, no wheezing, rales,rhonchi or crepitation. No use of accessory muscles of respiration.  CARDIOVASCULAR: S1, S2 normal. No murmurs, rubs, or gallops.  ABDOMEN: Soft, non-tender, non-distended. Bowel sounds present. No organomegaly or mass.  EXTREMITIES: No pedal edema, cyanosis, or clubbing.  NEUROLOGIC: Cranial nerves II through XII are intact. Muscle strength 5/5 in all extremities. Sensation intact. Gait not checked.  PSYCHIATRIC: The patient is alert and oriented x 3.  SKIN: No obvious rash, lesion, or ulcer.   DATA REVIEW:   CBC   Recent Labs Lab 07/22/17 0458  WBC 4.3  HGB 10.8*  HCT 31.4*  PLT 90*     Chemistries   Recent Labs Lab 07/22/17 0458  NA 143  K 3.2*  CL 111  CO2 25  GLUCOSE 91  BUN 15  CREATININE 0.88  CALCIUM 8.5*    Microbiology Results   No results found for this or any previous visit (from the past 240 hour(s)).  RADIOLOGY:  No results found.   Management plans discussed with the patient, family and they are in agreement.  CODE STATUS:     Code Status Orders        Start     Ordered   07/20/17 1819  Full code  Continuous     07/20/17 1818    Code Status History    Date Active Date Inactive Code Status Order ID Comments User Context   This patient has a current code status but no historical code status.      TOTAL TIME TAKING CARE OF THIS PATIENT: *40* minutes.    Ethie Curless M.D on 07/23/2017 at 7:48 AM  Between 7am to 6pm - Pager - [phone] After 6pm  go to [email or web address] - password [email or web address] [facility]  Office  [phone]  CC: Primary care physician; Idelle Crouch, MD

## 2017-07-23 NOTE — Progress Notes (Signed)
OT Cancellation Note  Patient Details Name: Kelsey Woodard MRN: [id number] DOB: 1951-05-29   Cancelled Treatment:    Reason Eval/Treat Not Completed: Other (comment). Pt preparing for discharge home with spouse upon attempt.   Jeni Salles, MPH, MS, OTR/L ascom [phone] 07/23/17, 1:05 PM

## 2017-07-23 NOTE — Progress Notes (Signed)
OT Note  Patient Details Name: Kelsey Woodard MRN: [id number] DOB: [birth year]   Note:    Reason Eval/Treat Not Completed: Other (comment). Noted in chart pt/spouse have selected to return home with home health services. Noted HHPT set up. Called and left voicemail with case manager to ensure [facility] OT is added. Per OT evaluation, skilled OT services are clinically indicated in order to maximize pt's functional independence and safety with self care tasks and functional mobility in the home.  Jeni Salles, MPH, MS, OTR/L ascom [phone] 07/23/17, 10:23 AM

## 2017-07-23 NOTE — Progress Notes (Signed)
PT is recommending home health, patient will D/C home today. RN case manager aware of above. Please reconsult if future social work needs arise. CSW signing off.   [employer], LCSW [phone]

## 2017-07-23 NOTE — Discharge Planning (Signed)
Patient IV removed.  RN assessment and VS revealed stability for DC to home with [facility].  Husband educated via teachback to administer Loven injections to wife at home.  Discharge papers given, explained and educated. Informed of suggested FU appts and appts made.  Given signed, printed scripts.  Once ready, will be wheeled to front and family transporting home via car.

## 2017-07-23 NOTE — Progress Notes (Signed)
   Subjective: 3 Days Post-Op Procedure(s) (LRB): CANNULATED HIP PINNING (Left) Patient reports pain as mild.   Patient is well, and has had no acute complaints or problems Denies any CP, SOB, ABD pain. We will continue therapy today.    Objective: Vital signs in last 24 hours: Temp:  [97.6 F (36.4 C)-98.5 F (36.9 C)] 98.5 F (36.9 C) (10/23 1948) Pulse Rate:  [76-80] 76 (10/23 1948) Resp:  [18] 18 (10/23 1948) BP: (154-163)/(74-82) 154/74 (10/23 1948) SpO2:  [96 %-100 %] 100 % (10/23 1948)  Intake/Output from previous day: 10/23 0701 - 10/24 0700 In: 600 [P.O.:600] Out: -  Intake/Output this shift: No intake/output data recorded.   Recent Labs  07/20/17 1243 07/21/17 0355 07/22/17 0458  HGB 12.8 11.2* 10.8*    Recent Labs  07/21/17 0355 07/22/17 0458  WBC 5.7 4.3  RBC 3.36* 3.20*  HCT 32.7* 31.4*  PLT 92* 90*    Recent Labs  07/21/17 0355 07/22/17 0458  NA 141 143  K 3.7 3.2*  CL 116* 111  CO2 24 25  BUN 16 15  CREATININE 0.88 0.88  GLUCOSE 107* 91  CALCIUM 8.6* 8.5*   No results for input(s): LABPT, INR in the last 72 hours.  EXAM General - Patient is Alert, Appropriate and Oriented Extremity - Neurovascular intact Sensation intact distally Intact pulses distally Dorsiflexion/Plantar flexion intact No cellulitis present Compartment soft Dressing - dressing C/D/I and scant drainage Motor Function - intact, moving foot and toes well on exam.   Past Medical History:  Diagnosis Date  . Endometriosis   . Glioblastoma ([place])   . Hyperlipidemia   . Mitral valve prolapse     Assessment/Plan:   3 Days Post-Op Procedure(s) (LRB): CANNULATED HIP PINNING (Left) Active Problems:   Closed left hip fracture (HCC)  Estimated body mass index is 19.33 kg/m as calculated from the following:   Height as of this encounter: 5' (1.524 m).   Weight as of this encounter: 44.9 kg (99 lb). Advance diet Up with therapy  Needs BM Acute post op blood  loss anemia - Hgb stable Discharge to home with HHPT today HHPT to remove staples and apply steri strips on 08/03/17 Follow up with [facility] in 6 weeks for recheck Lovenox 30 mg SQ daily x 14 days   DVT Prophylaxis - Lovenox, Foot Pumps and TED hose Weight-Bearing as tolerated to left leg   T. Rachelle Hora, PA-C [place] 07/23/2017, 8:04 AM

## 2017-07-23 NOTE — Progress Notes (Signed)
Physical Therapy Treatment Patient Details Name: Kelsey Woodard MRN: [id number] DOB: 1951/01/29 Today's Date: 07/23/2017    History of Present Illness presented to ER status post mechanical fall in home environment; admitted with L subcapital hip fracture, status post L cannulated hip pinning (07/20/17), WBAT per telephone clarification by Dr. Rudene Christians.  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    PT Comments    Pt agreeable to PT; denies pain about left hip today. Pt demonstrating improved mobility with min guard assist required for transfers, ambulation and stairs; verbal cues required intermittently for hand placement with transfers and sequence for stairs. Pt progressing distance with ambulation. Pt ready to discharge home with spouse with 24/7 care.    Follow Up Recommendations  Home health PT     Equipment Recommendations  Other (comment) (Has rw; spouse has secured plan for add'l equipment)    Recommendations for Other Services       Precautions / Restrictions Precautions Precautions: Fall Restrictions Weight Bearing Restrictions: Yes LLE Weight Bearing: Weight bearing as tolerated    Mobility  Bed Mobility               General bed mobility comments: Not tested; up in chair  Transfers Overall transfer level: Needs assistance Equipment used: Rolling walker (2 wheeled) Transfers: Sit to/from Stand Sit to Stand: Min guard         General transfer comment: Verbal cues for hand placement; pt performs safely, but slow to process hand placement, but does well with VC  Ambulation/Gait Ambulation/Gait assistance: Min guard Ambulation Distance (Feet): 70 Feet (second walk post rest, 40) Assistive device: Rolling walker (2 wheeled) Gait Pattern/deviations: Step-to pattern;Step-through pattern;Narrow base of support   Gait velocity interpretation: Below normal speed for age/gender General Gait Details: Starts with step  to pattern and progresses to partial step through as ambulation continues. Safe/steady   Stairs Stairs: Yes   Stair Management: One rail Right;Step to pattern (HHA on L) Number of Stairs: 3 General stair comments: Cues for sequence. Performs well with cues  Wheelchair Mobility    Modified Rankin (Stroke Patients Only)       Balance Overall balance assessment: Needs assistance Sitting-balance support: Feet supported;Bilateral upper extremity supported Sitting balance-Leahy Scale: Good     Standing balance support: Bilateral upper extremity supported Standing balance-Leahy Scale: Fair                              Cognition Arousal/Alertness: Awake/alert Behavior During Therapy: WFL for tasks assessed/performed Overall Cognitive Status: Within Functional Limits for tasks assessed                                        Exercises      General Comments        Pertinent Vitals/Pain Pain Assessment: No/denies pain    Home Living                      Prior Function            PT Goals (current goals can now be found in the care plan section) Progress towards PT goals: Progressing toward goals    Frequency    BID      PT Plan Discharge plan needs to be updated    Co-evaluation  AM-PAC PT "6 Clicks" Daily Activity  Outcome Measure  Difficulty turning over in bed (including adjusting bedclothes, sheets and blankets)?: A Little Difficulty moving from lying on back to sitting on the side of the bed? : Unable Difficulty sitting down on and standing up from a chair with arms (e.g., wheelchair, bedside commode, etc,.)?: Unable Help needed moving to and from a bed to chair (including a wheelchair)?: A Little Help needed walking in hospital room?: A Little Help needed climbing 3-5 steps with a railing? : A Little 6 Click Score: 14    End of Session Equipment Utilized During Treatment: Gait belt Activity  Tolerance: Patient tolerated treatment well Patient left: in chair;with call bell/phone within reach;with family/visitor present   PT Visit Diagnosis: Difficulty in walking, not elsewhere classified (R26.2);Muscle weakness (generalized) (M62.81);Pain Pain - Right/Left: Left Pain - part of body: Hip     Time: 9826-4158 PT Time Calculation (min) (ACUTE ONLY): 25 min  Charges:  $Gait Training: 23-37 mins                    G Codes:        Larae Grooms, PTA 07/23/2017, 10:23 AM

## 2017-07-23 NOTE — Care Management Note (Signed)
Case Management Note  Patient Details  Name: SAMYIA MOTTER MRN: [id number] Date of Birth: 04/24/1951  Subjective/Objective:  Discharging today                  Action/Plan: [facility] notified of discharge. [facility] [phone]. Called Lovenox 30 mg # 14 no refills. Carified dose with [facility]	[place]. Spouse request a bsc and transfer bench. Notified him of $60 fee for transfer bench.  He will pick up at [facility] store.   Expected Discharge Date:  07/23/17               Expected Discharge Plan:  [place]  In-House Referral:     Discharge planning Services  CM Consult  Post Acute Care Choice:  Durable Medical Equipment, Home Health Choice offered to:  Spouse, Adult Children  DME Arranged:  Walker rolling DME Agency:  [place] Arranged:  PT [place] Agency:  [place]  Status of Service:  Completed, signed off  If discussed at [place] of Stay Meetings, dates discussed:    Additional Comments:  Jolly Mango, RN 07/23/2017, 8:33 AM

## 2017-07-24 NOTE — Care Management (Signed)
OT arranged with [facility].

## 2017-07-25 DIAGNOSIS — E785 Hyperlipidemia, unspecified: Secondary | ICD-10-CM | POA: Diagnosis not present

## 2017-07-25 DIAGNOSIS — Z9889 Other specified postprocedural states: Secondary | ICD-10-CM | POA: Diagnosis not present

## 2017-07-25 DIAGNOSIS — C719 Malignant neoplasm of brain, unspecified: Secondary | ICD-10-CM | POA: Diagnosis not present

## 2017-07-25 DIAGNOSIS — Z9181 History of falling: Secondary | ICD-10-CM | POA: Diagnosis not present

## 2017-07-25 DIAGNOSIS — S72012D Unspecified intracapsular fracture of left femur, subsequent encounter for closed fracture with routine healing: Secondary | ICD-10-CM | POA: Diagnosis not present

## 2017-07-25 DIAGNOSIS — D6959 Other secondary thrombocytopenia: Secondary | ICD-10-CM | POA: Diagnosis not present

## 2017-07-28 ENCOUNTER — Emergency Department: Payer: PPO

## 2017-07-28 ENCOUNTER — Encounter: Payer: Self-pay | Admitting: Internal Medicine

## 2017-07-28 ENCOUNTER — Inpatient Hospital Stay
Admission: EM | Admit: 2017-07-28 | Discharge: 2017-07-31 | DRG: 055 | Disposition: A | Discharge status: Home / Self Care | Payer: PPO | Attending: Internal Medicine | Admitting: Internal Medicine

## 2017-07-28 DIAGNOSIS — Z923 Personal history of irradiation: Secondary | ICD-10-CM

## 2017-07-28 DIAGNOSIS — Z7982 Long term (current) use of aspirin: Secondary | ICD-10-CM | POA: Diagnosis not present

## 2017-07-28 DIAGNOSIS — Z9221 Personal history of antineoplastic chemotherapy: Secondary | ICD-10-CM

## 2017-07-28 DIAGNOSIS — C719 Malignant neoplasm of brain, unspecified: Principal | ICD-10-CM | POA: Diagnosis present

## 2017-07-28 DIAGNOSIS — R569 Unspecified convulsions: Secondary | ICD-10-CM | POA: Diagnosis not present

## 2017-07-28 DIAGNOSIS — I341 Nonrheumatic mitral (valve) prolapse: Secondary | ICD-10-CM | POA: Diagnosis not present

## 2017-07-28 DIAGNOSIS — E876 Hypokalemia: Secondary | ICD-10-CM | POA: Diagnosis not present

## 2017-07-28 DIAGNOSIS — Z87898 Personal history of other specified conditions: Secondary | ICD-10-CM | POA: Diagnosis not present

## 2017-07-28 DIAGNOSIS — E875 Hyperkalemia: Secondary | ICD-10-CM | POA: Diagnosis not present

## 2017-07-28 DIAGNOSIS — D6959 Other secondary thrombocytopenia: Secondary | ICD-10-CM | POA: Diagnosis not present

## 2017-07-28 DIAGNOSIS — S72002A Fracture of unspecified part of neck of left femur, initial encounter for closed fracture: Secondary | ICD-10-CM | POA: Diagnosis not present

## 2017-07-28 DIAGNOSIS — Z8349 Family history of other endocrine, nutritional and metabolic diseases: Secondary | ICD-10-CM

## 2017-07-28 DIAGNOSIS — D696 Thrombocytopenia, unspecified: Secondary | ICD-10-CM | POA: Diagnosis not present

## 2017-07-28 DIAGNOSIS — Z803 Family history of malignant neoplasm of breast: Secondary | ICD-10-CM

## 2017-07-28 DIAGNOSIS — Z8249 Family history of ischemic heart disease and other diseases of the circulatory system: Secondary | ICD-10-CM | POA: Diagnosis not present

## 2017-07-28 DIAGNOSIS — R4182 Altered mental status, unspecified: Secondary | ICD-10-CM | POA: Diagnosis not present

## 2017-07-28 DIAGNOSIS — T451X5A Adverse effect of antineoplastic and immunosuppressive drugs, initial encounter: Secondary | ICD-10-CM | POA: Diagnosis present

## 2017-07-28 DIAGNOSIS — E872 Acidosis: Secondary | ICD-10-CM | POA: Diagnosis present

## 2017-07-28 DIAGNOSIS — E785 Hyperlipidemia, unspecified: Secondary | ICD-10-CM | POA: Diagnosis present

## 2017-07-28 DIAGNOSIS — Z90710 Acquired absence of both cervix and uterus: Secondary | ICD-10-CM | POA: Diagnosis not present

## 2017-07-28 DIAGNOSIS — R410 Disorientation, unspecified: Secondary | ICD-10-CM

## 2017-07-28 DIAGNOSIS — R531 Weakness: Secondary | ICD-10-CM | POA: Diagnosis not present

## 2017-07-28 DIAGNOSIS — E2740 Unspecified adrenocortical insufficiency: Secondary | ICD-10-CM | POA: Diagnosis not present

## 2017-07-28 DIAGNOSIS — S7292XD Unspecified fracture of left femur, subsequent encounter for closed fracture with routine healing: Secondary | ICD-10-CM | POA: Diagnosis not present

## 2017-07-28 DIAGNOSIS — Z7401 Bed confinement status: Secondary | ICD-10-CM | POA: Diagnosis not present

## 2017-07-28 LAB — CBC WITH DIFFERENTIAL/PLATELET
BASOS PCT: 1 %
Basophils Absolute: 0 10*3/uL (ref 0–0.1)
EOS PCT: 1 %
Eosinophils Absolute: 0 10*3/uL (ref 0–0.7)
HCT: 33.8 % — ABNORMAL LOW (ref 35.0–47.0)
HEMOGLOBIN: 11.6 g/dL — AB (ref 12.0–16.0)
LYMPHS ABS: 0.5 10*3/uL — AB (ref 1.0–3.6)
Lymphocytes Relative: 13 %
MCH: 33.3 pg (ref 26.0–34.0)
MCHC: 34.4 g/dL (ref 32.0–36.0)
MCV: 97 fL (ref 80.0–100.0)
MONO ABS: 0.4 10*3/uL (ref 0.2–0.9)
Monocytes Relative: 10 %
Neutro Abs: 3 10*3/uL (ref 1.4–6.5)
Neutrophils Relative %: 75 %
PLATELETS: 129 10*3/uL — AB (ref 150–440)
RBC: 3.49 MIL/uL — ABNORMAL LOW (ref 3.80–5.20)
RDW: 16.5 % — ABNORMAL HIGH (ref 11.5–14.5)
WBC: 3.9 10*3/uL (ref 3.6–11.0)

## 2017-07-28 LAB — URINALYSIS, COMPLETE (UACMP) WITH MICROSCOPIC
Bacteria, UA: NONE SEEN
Bilirubin Urine: NEGATIVE
GLUCOSE, UA: NEGATIVE mg/dL
Ketones, ur: NEGATIVE mg/dL
Leukocytes, UA: NEGATIVE
Nitrite: NEGATIVE
PROTEIN: NEGATIVE mg/dL
Specific Gravity, Urine: 1.008 (ref 1.005–1.030)
pH: 8 (ref 5.0–8.0)

## 2017-07-28 LAB — COMPREHENSIVE METABOLIC PANEL
ALT: 41 U/L (ref 14–54)
AST: 50 U/L — AB (ref 15–41)
Albumin: 3.6 g/dL (ref 3.5–5.0)
Alkaline Phosphatase: 60 U/L (ref 38–126)
Anion gap: 8 (ref 5–15)
BUN: 17 mg/dL (ref 6–20)
CHLORIDE: 107 mmol/L (ref 101–111)
CO2: 25 mmol/L (ref 22–32)
CREATININE: 0.89 mg/dL (ref 0.44–1.00)
Calcium: 8.9 mg/dL (ref 8.9–10.3)
GFR calc Af Amer: >60 mL/min (ref >60)
GFR calc non Af Amer: >60 mL/min (ref >60)
GLUCOSE: 111 mg/dL — AB (ref 65–99)
Potassium: 3.3 mmol/L — ABNORMAL LOW (ref 3.5–5.1)
SODIUM: 140 mmol/L (ref 135–145)
Total Bilirubin: 0.8 mg/dL (ref 0.3–1.2)
Total Protein: 6.4 g/dL — ABNORMAL LOW (ref 6.5–8.1)

## 2017-07-28 LAB — LACTIC ACID, PLASMA: Lactic Acid, Venous: 2.9 mmol/L (ref 0.5–1.9)

## 2017-07-28 MED ORDER — LEVETIRACETAM 500 MG PO TABS
500.0000 mg | ORAL_TABLET | Freq: Two times a day (BID) | ORAL | Status: DC
  Filled 2017-07-28: qty 1

## 2017-07-28 MED ORDER — POTASSIUM CHLORIDE 10 MEQ/100ML IV SOLN
10.0000 meq | INTRAVENOUS | Status: AC
  Administered 2017-07-28 (×2): 10 meq via INTRAVENOUS
  Filled 2017-07-28 (×2): qty 100

## 2017-07-28 MED ORDER — HYDROCORTISONE NA SUCCINATE PF 100 MG IJ SOLR
50.0000 mg | Freq: Every day | INTRAMUSCULAR | Status: DC
  Administered 2017-07-28 – 2017-07-30 (×3): 50 mg via INTRAVENOUS
  Filled 2017-07-28 (×3): qty 1

## 2017-07-28 MED ORDER — SODIUM CHLORIDE 0.9 % IV SOLN
INTRAVENOUS | Status: DC
  Administered 2017-07-28: 13:00:00 via INTRAVENOUS

## 2017-07-28 MED ORDER — FLUTICASONE PROPIONATE 50 MCG/ACT NA SUSP
1.0000 | Freq: Two times a day (BID) | NASAL | Status: DC
  Administered 2017-07-30: 2 via NASAL
  Administered 2017-07-31: 1 via NASAL
  Filled 2017-07-28: qty 16

## 2017-07-28 MED ORDER — RENA-VITE PO TABS
1.0000 | ORAL_TABLET | Freq: Every day | ORAL | Status: DC
  Administered 2017-07-30 – 2017-07-31 (×2): 1 via ORAL
  Filled 2017-07-28 (×3): qty 1

## 2017-07-28 MED ORDER — SODIUM CHLORIDE 0.9 % IV SOLN
500.0000 mg | Freq: Two times a day (BID) | INTRAVENOUS | Status: DC
  Administered 2017-07-28 – 2017-07-29 (×2): 500 mg via INTRAVENOUS
  Filled 2017-07-28 (×3): qty 5

## 2017-07-28 MED ORDER — LORAZEPAM 2 MG/ML IJ SOLN
1.0000 mg | Freq: Once | INTRAMUSCULAR | Status: AC
  Administered 2017-07-28: 1 mg via INTRAVENOUS

## 2017-07-28 MED ORDER — SODIUM CHLORIDE 0.9 % IV SOLN
1000.0000 mg | Freq: Once | INTRAVENOUS | Status: AC
  Administered 2017-07-28: 1000 mg via INTRAVENOUS
  Filled 2017-07-28: qty 10

## 2017-07-28 MED ORDER — ONDANSETRON HCL 4 MG PO TABS: 4.0000 mg | ORAL_TABLET | Freq: Four times a day (QID) | ORAL | Status: DC | PRN

## 2017-07-28 MED ORDER — ACETAMINOPHEN 325 MG PO TABS: 650.0000 mg | ORAL_TABLET | Freq: Four times a day (QID) | ORAL | Status: DC | PRN

## 2017-07-28 MED ORDER — LORAZEPAM 0.5 MG PO TABS
0.5000 mg | ORAL_TABLET | Freq: Four times a day (QID) | ORAL | Status: DC | PRN
  Administered 2017-07-28: 0.5 mg via ORAL
  Filled 2017-07-28: qty 1

## 2017-07-28 MED ORDER — ONDANSETRON HCL 4 MG/2ML IJ SOLN
4.0000 mg | Freq: Four times a day (QID) | INTRAMUSCULAR | Status: DC | PRN
  Administered 2017-07-28: 4 mg via INTRAVENOUS
  Filled 2017-07-28: qty 2

## 2017-07-28 MED ORDER — ENOXAPARIN SODIUM 30 MG/0.3ML ~~LOC~~ SOLN
30.0000 mg | SUBCUTANEOUS | Status: DC
  Administered 2017-07-28 – 2017-07-30 (×3): 30 mg via SUBCUTANEOUS
  Filled 2017-07-28 (×3): qty 0.3

## 2017-07-28 MED ORDER — LORATADINE 10 MG PO TABS
10.0000 mg | ORAL_TABLET | Freq: Every day | ORAL | Status: DC
  Administered 2017-07-30 – 2017-07-31 (×2): 10 mg via ORAL
  Filled 2017-07-28 (×2): qty 1

## 2017-07-28 MED ORDER — TRAMADOL HCL 50 MG PO TABS
50.0000 mg | ORAL_TABLET | Freq: Four times a day (QID) | ORAL | Status: DC | PRN
  Administered 2017-07-31: 10:00:00 50 mg via ORAL
  Filled 2017-07-28: qty 1

## 2017-07-28 MED ORDER — HYDROCORTISONE 20 MG PO TABS
20.0000 mg | ORAL_TABLET | Freq: Two times a day (BID) | ORAL | Status: DC
  Filled 2017-07-28 (×2): qty 1

## 2017-07-28 MED ORDER — ATORVASTATIN CALCIUM 20 MG PO TABS
10.0000 mg | ORAL_TABLET | Freq: Every day | ORAL | Status: DC
  Administered 2017-07-30 – 2017-07-31 (×2): 10 mg via ORAL
  Filled 2017-07-28 (×2): qty 1

## 2017-07-28 MED ORDER — LORAZEPAM 2 MG/ML IJ SOLN
INTRAMUSCULAR | Status: AC
  Filled 2017-07-28: qty 1

## 2017-07-28 MED ORDER — ACETAMINOPHEN 650 MG RE SUPP: 650.0000 mg | Freq: Four times a day (QID) | RECTAL | Status: DC | PRN

## 2017-07-28 NOTE — ED Notes (Signed)
Date and time results received: 07/28/17 7:35 AM  (use smartphrase ".now" to insert current time)  Test: lactic acid Critical Value: 2.9  Name of Provider Notified: Dr. Cinda Quest  Orders Received? Or Actions Taken?: Orders Received - See Orders for details

## 2017-07-28 NOTE — H&P (Signed)
[facility] NAME: Kelsey Woodard    MR#:  [id number]  DATE OF BIRTH:  08-31-1951  DATE OF ADMISSION:  07/28/2017  PRIMARY CARE PHYSICIAN: Idelle Crouch, MD   REQUESTING/REFERRING PHYSICIAN: Conni Slipper, MD  CHIEF COMPLAINT:   Chief Complaint  Patient presents with  . Seizures  . Altered Mental Status    HISTORY OF PRESENT ILLNESS:  Kelsey Woodard  is a 66 y.o. female with a known history of glioblastoma.  She presents to the ER with her first ever major seizure.  Her husband helped her to the bathroom and back to bed.  He heard a groaning noise.  He then saw generalized jerking movements. She was also gurgling.  She did not lose urine.  She may have bit the inside of her mouth.  He states that in the past she had a few minor seizures and episodes of confusion.  She was recently here for hip fracture.  PAST MEDICAL HISTORY:   Past Medical History:  Diagnosis Date  . Endometriosis   . Glioblastoma ([place])   . Hyperlipidemia   . Mitral valve prolapse     PAST SURGICAL HISTORY:   Past Surgical History:  Procedure Laterality Date  . ABDOMINAL HYSTERECTOMY  2000   Dr. Rayford Halsted  . CESAREAN SECTION     1  . glioblastoma surgery    . HIP PINNING,CANNULATED Left 07/20/2017   Procedure: CANNULATED HIP PINNING;  Surgeon: Claud Kelp, MD;  Location: [facility] ORS;  Service: Orthopedics;  Laterality: Left;  . TONSILLECTOMY    . TOTAL ABDOMINAL HYSTERECTOMY W/ BILATERAL SALPINGOOPHORECTOMY  2000    SOCIAL HISTORY:   Social History  Substance Use Topics  . Smoking status: Never Smoker  . Smokeless tobacco: Never Used  . Alcohol use No    FAMILY HISTORY:   Family History  Problem Relation Age of Onset  . Cancer Father        Bladder - 77's  . Hyperlipidemia Father   . Heart disease Father   . Cancer Maternal Aunt        Breast - 26's  . Breast cancer Maternal Aunt 82    DRUG ALLERGIES:  No Known  Allergies  REVIEW OF SYSTEMS:  Limited because she is still post ictal  CONSTITUTIONAL: No fever, positive for weakness.  EYES: Some blurred vision.  Wears glasses as per family EARS, NOSE, AND THROAT: Some runny nose.  Occasional sore throat RESPIRATORY: No cough, shortness of breath, wheezing or hemoptysis.  CARDIOVASCULAR: No chest pain.  GASTROINTESTINAL: No nausea, vomiting, diarrhea or abdominal pain. GENITOURINARY: No dysuria.  SKIN: No rash or lesion. MUSCULOSKELETAL: Patient did not answer if she had joint pain.  Family says some hip pain.[name]   NEUROLOGIC: Positive for seizure    MEDICATIONS AT HOME:   Prior to Admission medications   Medication Sig Start Date End Date Taking? Authorizing Provider  atorvastatin (LIPITOR) 20 MG tablet Take 0.5 tablets (10 mg total) by mouth daily. 01/02/12  Yes Jackolyn Confer, MD  b complex vitamins tablet Take 1 tablet by mouth daily.   Yes [provider]  enoxaparin (LOVENOX) 30 MG/0.3ML injection Inject 0.3 mLs (30 mg total) into the skin daily. 07/22/17  Yes Duanne Guess, PA-C  fluticasone (FLONASE) 50 MCG/ACT nasal spray Place 1-2 sprays into both nostrils 2 (two) times daily. 06/24/17  Yes [provider]  hydrocortisone (CORTEF) 10 MG tablet Take 30 mg by mouth  daily. 07/21/17  Yes [provider]  cetirizine (ZYRTEC) 10 MG tablet TAKE 1 TABLET (10 MG TOTAL) BY MOUTH DAILY. 12/21/12   Jackolyn Confer, MD  conjugated estrogens (PREMARIN) vaginal cream Place 1.60 Applicatorfuls vaginally 2 (two) times a week. Or as directed Patient not taking: Reported on 07/28/2017 06/24/12   Jackolyn Confer, MD  traMADol (ULTRAM) 50 MG tablet Take 1 tablet (50 mg total) by mouth every 6 (six) hours as needed for moderate pain. 07/22/17   Duanne Guess, PA-C      VITAL SIGNS:  Blood pressure (!) 165/108, pulse (!) 109, temperature 98.2 F (36.8 C), temperature source Oral, resp. rate 20, height 5' (1.524 m),  weight 44.9 kg (99 lb), SpO2 97 %.  PHYSICAL EXAMINATION:  GENERAL:  66 y.o.-year-old patient lying in the bed with no acute distress.  EYES: Pupils equal, round, reactive to light and accommodation. No scleral icterus. HEENT: Head atraumatic, normocephalic. Oropharynx and nasopharynx clear.  NECK:  Supple, no jugular venous distention. No thyroid enlargement, no tenderness.  LUNGS: Normal breath sounds bilaterally, no wheezing, rales,rhonchi or crepitation. No use of accessory muscles of respiration.  CARDIOVASCULAR: S1, S2 normal. No murmurs, rubs, or gallops.  ABDOMEN: Soft, nontender, nondistended. Bowel sounds present. No organomegaly or mass.  EXTREMITIES: No pedal edema, cyanosis, or clubbing.  NEUROLOGIC: Patient able to move all extremities bilaterally on her own.  Able to squeeze my hands bilaterally with her hands. PSYCHIATRIC: The patient is lethargic but does answer a few questions.[name]  SKIN: No rash, lesion, or ulcer.   LABORATORY PANEL:   CBC  Recent Labs Lab 07/28/17 0649  WBC 3.9  HGB 11.6*  HCT 33.8*  PLT 129*   ------------------------------------------------------------------------------------------------------------------  Chemistries   Recent Labs Lab 07/28/17 0649  NA 140  K 3.3*  CL 107  CO2 25  GLUCOSE 111*  BUN 17  CREATININE 0.89  CALCIUM 8.9  AST 50*  ALT 41  ALKPHOS 60  BILITOT 0.8   ------------------------------------------------------------------------------------------------------------------    RADIOLOGY:  Dg Chest 2 View  Result Date: 07/28/2017 CLINICAL DATA:  Seizure activity ; increased confusion over the past several weeks. History of glioblastoma with surgery earlier this year. EXAM: CHEST  2 VIEW COMPARISON:  Chest x-ray of July 20, 2017 FINDINGS: The lungs are adequately inflated and clear. The heart and pulmonary vascularity are normal. The mediastinum is normal in width. There is no pleural effusion. The bony thorax  is unremarkable. IMPRESSION: There is no active cardiopulmonary disease. Electronically Signed   By: David  Martinique M.D.   On: 07/28/2017 07:54   Ct Head Wo Contrast  Result Date: 07/28/2017 CLINICAL DATA:  Witnessed seizure.  History of glioblastoma. EXAM: CT HEAD WITHOUT CONTRAST TECHNIQUE: Contiguous axial images were obtained from the base of the skull through the vertex without intravenous contrast. COMPARISON:  12/01/2016 CT. FINDINGS: The patient was unable to remain motionless for the exam. Small or subtle lesions could be overlooked. Brain: No visible acute stroke, acute hemorrhage, mass lesion, hydrocephalus, or extra-axial fluid. Normal for age cerebral volume. Extensive hypoattenuation throughout the white matter, LEFT greater than RIGHT, likely post treatment effect from radiation. Encephalomalacia LEFT inferior frontal lobe, status post surgical debulking of a large tumor, reportedly glioblastoma. Vascular: Calcification of the cavernous internal carotid arteries consistent with cerebrovascular atherosclerotic disease. No signs of intracranial large vessel occlusion. Skull: LEFT frontal craniotomy defect appears uncomplicated. There is no skull fracture. Sinuses/Orbits: No acute finding. Other: None. IMPRESSION: No acute intracranial findings.  Postsurgical changes LEFT frontal lobe. Extensive hypoattenuation of the white matter, likely post treatment effect. If further investigation desired, MRI of the brain without and with contrast is recommended. This would most optimally be performed when the patient is stable to remain motionless. Electronically Signed   By: Staci Righter M.D.   On: 07/28/2017 07:51     IMPRESSION AND PLAN:   1.  Generalized seizure with postictal period.  Patient has a history of glioblastoma.  Case discussed with Dr. Doy Mince neurology.  Keppra load IV now.  Oral Keppra 500 mg twice daily later today.  No need to do an MRI of the brain or EEG if mental status improves.   Admit as observation.  Hopefully can start diet later today 2.  Weakness and recent hip fracture.  Physical therapy evaluation 3.  History of glioblastoma.  Surgery 12/05/2016, 6 weeks of radiation and chemo.  As per husband still has some glioblastoma there.  Reviewed CODE STATUS and patient is a full code. 4.  Hyperlipidemia unspecified on atorvastatin 5.  Hypokalemia.  Replace potassium IV. 6.  Lactic acidosis likely secondary to seizure 7.  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 Management plans discussed with the patient, family and they are in agreement.  CODE STATUS: Full code  TOTAL TIME TAKING CARE OF THIS PATIENT: 50 minutes.    Loletha Grayer M.D on 07/28/2017 at 10:40 AM  Between 7am to 6pm - Pager - [phone]  After 6pm call admission pager [phone]  [facility]  [phone]  CC: Primary care physician; Idelle Crouch, MD

## 2017-07-28 NOTE — Progress Notes (Signed)
Pharmacist - Prescriber Communication  Enoxaparin dose modified from 40 mg subcutaneously once daily to 30 mg subcutaneously once daily due to ABW less than 45 kg.  Nida Manfredi A. [place], [place].D., BCPS Clinical Pharmacist 07/28/2017 12:15

## 2017-07-28 NOTE — Progress Notes (Signed)
PT Cancellation Note  Patient Details Name: Kelsey Woodard MRN: [id number] DOB: 02-17-1951   Cancelled Treatment:    Reason Eval/Treat Not Completed: Fatigue/lethargy limiting ability to participate.  Per RN pt very drowsy and confused and unable to participate in PT at this time.  Will continue to follow acutely and will attempt to see pt 10/30.   Collie Siad PT, DPT 07/28/2017, 2:21 PM

## 2017-07-28 NOTE — Plan of Care (Signed)
Problem: Physical Regulation: Goal: Ability to maintain clinical measurements within normal limits will improve Outcome: Progressing Pt admitted from the ED. VSS. No seizure activity noted during the shift. Pt drowsy and unable to follow commands. Pt restless anxious towards the end of shift. Dr Leslye Peer notified. Ativan ordered and given. Zofran given once for nausea.

## 2017-07-28 NOTE — ED Notes (Signed)
Dr. Doy Mince neurologist at bedside.

## 2017-07-28 NOTE — ED Notes (Signed)
ED Provider at bedside. 

## 2017-07-28 NOTE — ED Provider Notes (Signed)
[facility] Emergency Department Provider Note   ____________________________________________   First MD Initiated Contact with Patient 07/28/17 0631     (approximate)  I have reviewed the triage vital signs and the nursing notes.   HISTORY  Chief Complaint Seizures and Altered Mental Status  history Limited due to patient confusion  HPI Kelsey Woodard is a 66 y.o. female who comes into the hospital today with a generalized seizure. The patient has a history of seizures and had a 30 second seizure at home. The patient is also had a recent hip fracture with the replacement. The patient just got home and once here Thursday. She's been having some increasing confusion over the last few weeks. The patient husband states that she has a history of glioblastoma and he was planning to contact her physicians at [facility] but then she ended up here after a fall. The patient normally comes around quickly but she is taking a long time to return to baseline. The patient has a CBG of 114. She seems more confused according to her husband at this time. She did not receive her regular medications this morning.   Past Medical History:  Diagnosis Date  . Endometriosis   . Glioblastoma ([place])   . Hyperlipidemia   . Mitral valve prolapse     Patient Active Problem List   Diagnosis Date Noted  . Closed left hip fracture ([place]) 07/20/2017  . Hyperlipidemia LDL goal < 100 01/02/2012  . Seasonal allergies 01/02/2012  . Atrophic vaginitis 01/02/2012  . Vaccine for VZV (varicella-zoster virus) 01/02/2012    Past Surgical History:  Procedure Laterality Date  . ABDOMINAL HYSTERECTOMY  2000   Dr. Rayford Halsted  . CESAREAN SECTION     1  . HIP PINNING,CANNULATED Left 07/20/2017   Procedure: CANNULATED HIP PINNING;  Surgeon: Claud Kelp, MD;  Location: [facility] ORS;  Service: Orthopedics;  Laterality: Left;  . TONSILLECTOMY    . TOTAL ABDOMINAL HYSTERECTOMY W/ BILATERAL SALPINGOOPHORECTOMY   2000    Prior to Admission medications   Medication Sig Start Date End Date Taking? Authorizing Provider  atorvastatin (LIPITOR) 20 MG tablet Take 0.5 tablets (10 mg total) by mouth daily. 01/02/12   Jackolyn Confer, MD  b complex vitamins tablet Take 1 tablet by mouth daily.    [provider]  cetirizine (ZYRTEC) 10 MG tablet TAKE 1 TABLET (10 MG TOTAL) BY MOUTH DAILY. 12/21/12   Jackolyn Confer, MD  conjugated estrogens (PREMARIN) vaginal cream Place 9.32 Applicatorfuls vaginally 2 (two) times a week. Or as directed Patient not taking: Reported on 07/20/2017 06/24/12   Jackolyn Confer, MD  enoxaparin (LOVENOX) 30 MG/0.3ML injection Inject 0.3 mLs (30 mg total) into the skin daily. 07/22/17   Duanne Guess, PA-C  fluticasone (FLONASE) 50 MCG/ACT nasal spray Place 1-2 sprays into both nostrils 2 (two) times daily. 06/24/17   [provider]  traMADol (ULTRAM) 50 MG tablet Take 1 tablet (50 mg total) by mouth every 6 (six) hours as needed for moderate pain. 07/22/17   Duanne Guess, PA-C    Allergies Patient has no known allergies.  Family History  Problem Relation Age of Onset  . Cancer Father        Bladder - 59's  . Hyperlipidemia Father   . Heart disease Father   . Cancer Maternal Aunt        Breast - 67's  . Breast cancer Maternal Aunt 82    Social History  Social History  Substance Use Topics  . Smoking status: Never Smoker  . Smokeless tobacco: Never Used  . Alcohol use No    Review of Systems  Neurological: seizure and confusion  patient confused and unable to answer questions likely and a postictal state.   ____________________________________________   PHYSICAL EXAM:  VITAL SIGNS: ED Triage Vitals  Enc Vitals Group     BP 07/28/17 0638 (!) 165/108     Pulse Rate 07/28/17 0638 (!) 109     Resp 07/28/17 0638 20     Temp 07/28/17 0638 98.2 F (36.8 C)     Temp Source 07/28/17 0638 Oral     SpO2 07/28/17 0638 97 %     Weight  07/28/17 0643 99 lb (44.9 kg)     Height 07/28/17 0643 5' (1.524 m)     Head Circumference --      Peak Flow --      Pain Score --      Pain Loc --      Pain Edu? --      Excl. in [place]? --     Constitutional: awake but confused. Patient not answering questions.. Well appearing and in no acute distress. Eyes: Conjunctivae are normal. PERRL. EOMI. Head: Atraumatic. Nose: No congestion/rhinnorhea. Mouth/Throat: Mucous membranes are moist.  Oropharynx non-erythematous. Cardiovascular: Normal rate, regular rhythm. Grossly normal heart sounds.  Good peripheral circulation. Respiratory: Normal respiratory effort.  No retractions. Lungs CTAB. Gastrointestinal: Soft and nontender. No distention. positive bowel sounds Musculoskeletal: pain to left hip with straight leg. Neurologic:  patient will occasionally mumble but staring off. Not following commands or answering questions at this time. Patient breathing well on her own and moving extremities aside from left leg. Skin:  Skin is warm, dry and intact.  Psychiatric: Mood and affect are normal.   ____________________________________________   LABS (all labs ordered are listed, but only abnormal results are displayed)  Labs Reviewed  COMPREHENSIVE METABOLIC PANEL - Abnormal; Notable for the following:       Result Value   Potassium 3.3 (*)    Glucose, Bld 111 (*)    Total Protein 6.4 (*)    AST 50 (*)    All other components within normal limits  CBC WITH DIFFERENTIAL/PLATELET - Abnormal; Notable for the following:    RBC 3.49 (*)    Hemoglobin 11.6 (*)    HCT 33.8 (*)    RDW 16.5 (*)    Platelets 129 (*)    Lymphs Abs 0.5 (*)    All other components within normal limits  LACTIC ACID, PLASMA - Abnormal; Notable for the following:    Lactic Acid, Venous 2.9 (*)    All other components within normal limits  URINALYSIS, COMPLETE (UACMP) WITH MICROSCOPIC  CBG MONITORING, ED    ____________________________________________  EKG  Deferred to oncoming physician ____________________________________________  RADIOLOGY  No results found.  ____________________________________________   PROCEDURES  Procedure(s) performed: None  Procedures  Critical Care performed: No  ____________________________________________   INITIAL IMPRESSION / ASSESSMENT AND PLAN / ED COURSE  As part of my medical decision making, I reviewed the following data within the electronic MEDICAL RECORD NUMBER Notes from prior ED visits and [place] Controlled Substance Database   This is a 66 year old female who comes in with a seizure today. The patient is also had some confusion and some altered mental status. The patient's  post ictal phase is prolonged.  My differential diagnosis includes urinary tract infection, pneumonia, dehydration, intracranial mass.  The patient's care was signed out to Dr. Cinda Quest who will follow-up the results of the imaging studies and the blood work. He will reassess the patient.        ____________________________________________   FINAL CLINICAL IMPRESSION(S) / ED DIAGNOSES  Final diagnoses:  Seizure ([place])  Disorientation      NEW MEDICATIONS STARTED DURING THIS VISIT:  New Prescriptions   No medications on file     Note:  This document was prepared using Dragon voice recognition software and may include unintentional dictation errors.    Loney Hering, MD 07/28/17 [phone]

## 2017-07-28 NOTE — ED Provider Notes (Signed)
CT returns shows no acute changes lab work also shows no etiology for her continued altered mental status family is concerned may call Dr. Doy Mince who came down to see the patient and recommends giving her some Keppra and observing her in the hospital until she returns to her baseline. I have paged the hospitalist.   Nena Polio, MD 07/28/17 1011

## 2017-07-28 NOTE — ED Notes (Signed)
Pt placed on bedpan

## 2017-07-28 NOTE — Care Management Note (Signed)
Case Management Note  Patient Details  Name: LAFAYE MCELMURRY MRN: [id number] Date of Birth: [birth year]  Subjective/Objective:  Admitted to [facility] under observation status with the diagnosis of seizures. Discharged from this facility 07/23/17 following a hip pinning that was done 07/21/17.  Lives with husband, [phone]). Last seen Dr. Doy Hutching 03/24/17. Prescriptions are filled at [facility]. Followed by [facility] in the home. Family is in the home 24/7. No skilled nursing. No home oxygen. Bedside commode, transfer bench, and rolling walker in the home. Gets chemotherapy at [facility] every other Thursday.  Self feed, some help with baths and dressing since surgery. No falls. Good appetite                Action/Plan: Resumption of home health orders at discharge    Expected Discharge Date:                  Expected Discharge Plan:     In-House Referral:     Discharge planning Services     Post Acute Care Choice:    Choice offered to:     DME Arranged:    DME Agency:     HH Arranged:    HH Agency:     Status of Service:     If discussed at [employer], dates discussed:    Additional Comments:  Shelbie Ammons, RN MSN CCM Care Management [phone] 07/28/2017, 2:46 PM

## 2017-07-28 NOTE — Consult Note (Signed)
Reason for Consult:Seizure Referring Physician: Cinda Quest  CC: Seizure  HPI: Kelsey Woodard is an 66 y.o. female with a history of GBM s/p resection, radiation and undergoing chemo, known to have subtotal resection who presents today with GTC seizure.  Per report of husband patient with seizures that were characterized by smelling things in the past.  Has never had a generalized seizure until today.  Her husband helped her to the bathroom and back to bed this morning.  He heard a groaning noise.  He then saw generalized jerking movements. She was also gurgling.  Patient had had progressive confusion over the past few weeks.  Has recently been tapered off Decadron but due to the confusion was started on hydrocortisone.  Patient has not been on anticonvulsant therapy other than with surgery.    Past Medical History:  Diagnosis Date  . Endometriosis   . Glioblastoma ([place])   . Hyperlipidemia   . Mitral valve prolapse     Past Surgical History:  Procedure Laterality Date  . ABDOMINAL HYSTERECTOMY  2000   Dr. Rayford Halsted  . CESAREAN SECTION     1  . HIP PINNING,CANNULATED Left 07/20/2017   Procedure: CANNULATED HIP PINNING;  Surgeon: Claud Kelp, MD;  Location: [facility] ORS;  Service: Orthopedics;  Laterality: Left;  . TONSILLECTOMY    . TOTAL ABDOMINAL HYSTERECTOMY W/ BILATERAL SALPINGOOPHORECTOMY  2000    Family History  Problem Relation Age of Onset  . Cancer Father        Bladder - 58's  . Hyperlipidemia Father   . Heart disease Father   . Cancer Maternal Aunt        Breast - 42's  . Breast cancer Maternal Aunt 82    Social History:  reports that she has never smoked. She has never used smokeless tobacco. She reports that she does not drink alcohol or use drugs.  No Known Allergies  Medications:  I have reviewed the patient's current medications. Prior to Admission:  Prescriptions Prior to Admission  Medication Sig Dispense Refill Last Dose  . atorvastatin (LIPITOR) 20 MG tablet  Take 0.5 tablets (10 mg total) by mouth daily. 90 tablet 3 07/27/2017 at Unknown time  . b complex vitamins tablet Take 1 tablet by mouth daily.   07/27/2017 at Unknown time  . enoxaparin (LOVENOX) 30 MG/0.3ML injection Inject 0.3 mLs (30 mg total) into the skin daily. 14 Syringe 0 07/27/2017 at Unknown time  . fluticasone (FLONASE) 50 MCG/ACT nasal spray Place 1-2 sprays into both nostrils 2 (two) times daily.   07/27/2017 at Unknown time  . hydrocortisone (CORTEF) 10 MG tablet Take 30 mg by mouth daily.   07/27/2017 at Unknown time  . cetirizine (ZYRTEC) 10 MG tablet TAKE 1 TABLET (10 MG TOTAL) BY MOUTH DAILY. 30 tablet 2 prn at prn  . conjugated estrogens (PREMARIN) vaginal cream Place 3.53 Applicatorfuls vaginally 2 (two) times a week. Or as directed (Patient not taking: Reported on 07/28/2017) 42.5 g 6 Not Taking at Unknown time  . traMADol (ULTRAM) 50 MG tablet Take 1 tablet (50 mg total) by mouth every 6 (six) hours as needed for moderate pain. 30 tablet 0 prn at prn   Scheduled: . [START ON 07/29/2017] atorvastatin  10 mg Oral Daily  . enoxaparin (LOVENOX) injection  30 mg Subcutaneous Q24H  . fluticasone  1-2 spray Each Nare BID  . hydrocortisone  20 mg Oral BID  . levETIRAcetam  500 mg Oral BID  . loratadine  10  mg Oral Daily  . [START ON 07/29/2017] multivitamin  1 tablet Oral Daily    ROS: History obtained from husband  General ROS: negative for - chills, fatigue, fever, night sweats, weight gain or weight loss Psychological ROS: confusion, lethargy Ophthalmic ROS: negative for - blurry vision, double vision, eye pain or loss of vision ENT ROS: negative for - epistaxis, nasal discharge, oral lesions, sore throat, tinnitus or vertigo Allergy and Immunology ROS: negative for - hives or itchy/watery eyes Hematological and Lymphatic ROS: negative for - bleeding problems, bruising or swollen lymph nodes Endocrine ROS: negative for - galactorrhea, hair pattern changes,  polydipsia/polyuria or temperature intolerance Respiratory ROS: negative for - cough, hemoptysis, shortness of breath or wheezing Cardiovascular ROS: negative for - chest pain, dyspnea on exertion, edema or irregular heartbeat Gastrointestinal ROS: negative for - abdominal pain, diarrhea, hematemesis, nausea/vomiting or stool incontinence Genito-Urinary ROS: negative for - dysuria, hematuria, incontinence or urinary frequency/urgency Musculoskeletal ROS: hip pain Neurological ROS: as noted in HPI Dermatological ROS: negative for rash and skin lesion changes  Physical Examination: Blood pressure (!) 165/108, pulse (!) 109, temperature 98.2 F (36.8 C), temperature source Oral, resp. rate 20, height 5' (1.524 m), weight 44.9 kg (99 lb), SpO2 97 %.  HEENT-  Normocephalic, no lesions, without obvious abnormality.  Normal external eye and conjunctiva.  Normal TM's bilaterally.  Normal auditory canals and external ears. Normal external nose, mucus membranes and septum.  Normal pharynx. Cardiovascular- S1, S2 normal, pulses palpable throughout   Lungs- chest clear, no wheezing, rales, normal symmetric air entry Abdomen- soft, non-tender; bowel sounds normal; no masses,  no organomegaly Extremities- no edema Lymph-no adenopathy palpable Musculoskeletal-no joint tenderness, deformity or swelling Skin-warm and dry, no hyperpigmentation, vitiligo, or suspicious lesions  Neurological Examination   Mental Status: Lethargic.  Restless.  Follows simple commands.  Speech minimal but fluent.   Cranial Nerves: II: Discs flat bilaterally; Blinks to bilateral confrontation.  Visual fields grossly normal, pupils equal, round, reactive to light and accommodation III,IV, VI: ptosis not present, extra-ocular motions intact bilaterally V,VII: smile symmetric, facial light touch sensation normal bilaterally VIII: hearing normal bilaterally IX,X: gag reflex present XI: bilateral shoulder shrug XII: midline  tongue extension Motor: Moves all extremities against gravity.   Sensory: Responds to light stimuli throughout. Deep Tendon Reflexes: 2+ and symmetric throughout Plantars: Right: upgoing   Left: upgoing Cerebellar: Unable to perform due to lethargy Gait: Unable to perform due to lethargy    Laboratory Studies:   Basic Metabolic Panel:  Recent Labs Lab 07/22/17 0458 07/28/17 0649  NA 143 140  K 3.2* 3.3*  CL 111 107  CO2 25 25  GLUCOSE 91 111*  BUN 15 17  CREATININE 0.88 0.89  CALCIUM 8.5* 8.9    Liver Function Tests:  Recent Labs Lab 07/28/17 0649  AST 50*  ALT 41  ALKPHOS 60  BILITOT 0.8  PROT 6.4*  ALBUMIN 3.6   No results for input(s): LIPASE, AMYLASE in the last 168 hours. No results for input(s): AMMONIA in the last 168 hours.  CBC:  Recent Labs Lab 07/22/17 0458 07/28/17 0649  WBC 4.3 3.9  NEUTROABS  --  3.0  HGB 10.8* 11.6*  HCT 31.4* 33.8*  MCV 97.9 97.0  PLT 90* 129*    Cardiac Enzymes: No results for input(s): CKTOTAL, CKMB, CKMBINDEX, TROPONINI in the last 168 hours.  BNP: Invalid input(s): POCBNP  CBG: No results for input(s): GLUCAP in the last 168 hours.  Microbiology: No results found  for this or any previous visit.  Coagulation Studies: No results for input(s): LABPROT, INR in the last 72 hours.  Urinalysis:  Recent Labs Lab 07/28/17 0858  COLORURINE STRAW*  LABSPEC 1.008  PHURINE 8.0  GLUCOSEU NEGATIVE  HGBUR SMALL*  BILIRUBINUR NEGATIVE  KETONESUR NEGATIVE  PROTEINUR NEGATIVE  NITRITE NEGATIVE  LEUKOCYTESUR NEGATIVE    Lipid Panel:     Component Value Date/Time   CHOL 170 01/02/2012 1715   TRIG 59.0 01/02/2012 1715   HDL 49.10 01/02/2012 1715   CHOLHDL 3 01/02/2012 1715   VLDL 11.8 01/02/2012 1715   LDLCALC 109 (H) 01/02/2012 1715    HgbA1C: No results found for: HGBA1C  Urine Drug Screen:  No results found for: LABOPIA, COCAINSCRNUR, LABBENZ, AMPHETMU, THCU, LABBARB  Alcohol Level: No results for  input(s): ETH in the last 168 hours.   Imaging: Dg Chest 2 View  Result Date: 07/28/2017 CLINICAL DATA:  Seizure activity ; increased confusion over the past several weeks. History of glioblastoma with surgery earlier this year. EXAM: CHEST  2 VIEW COMPARISON:  Chest x-ray of July 20, 2017 FINDINGS: The lungs are adequately inflated and clear. The heart and pulmonary vascularity are normal. The mediastinum is normal in width. There is no pleural effusion. The bony thorax is unremarkable. IMPRESSION: There is no active cardiopulmonary disease. Electronically Signed   By: David  Martinique M.D.   On: 07/28/2017 07:54   Ct Head Wo Contrast  Result Date: 07/28/2017 CLINICAL DATA:  Witnessed seizure.  History of glioblastoma. EXAM: CT HEAD WITHOUT CONTRAST TECHNIQUE: Contiguous axial images were obtained from the base of the skull through the vertex without intravenous contrast. COMPARISON:  12/01/2016 CT. FINDINGS: The patient was unable to remain motionless for the exam. Small or subtle lesions could be overlooked. Brain: No visible acute stroke, acute hemorrhage, mass lesion, hydrocephalus, or extra-axial fluid. Normal for age cerebral volume. Extensive hypoattenuation throughout the white matter, LEFT greater than RIGHT, likely post treatment effect from radiation. Encephalomalacia LEFT inferior frontal lobe, status post surgical debulking of a large tumor, reportedly glioblastoma. Vascular: Calcification of the cavernous internal carotid arteries consistent with cerebrovascular atherosclerotic disease. No signs of intracranial large vessel occlusion. Skull: LEFT frontal craniotomy defect appears uncomplicated. There is no skull fracture. Sinuses/Orbits: No acute finding. Other: None. IMPRESSION: No acute intracranial findings. Postsurgical changes LEFT frontal lobe. Extensive hypoattenuation of the white matter, likely post treatment effect. If further investigation desired, MRI of the brain without and  with contrast is recommended. This would most optimally be performed when the patient is stable to remain motionless. Electronically Signed   By: Staci Righter M.D.   On: 07/28/2017 07:51     Assessment/Plan: 66 year old female with a history of GBM s/p resection, radiation and undergoing chemo, known to have subtotal resection who presents today with GTC seizure.  Head CT reviewed and shows postsurgical changes but no acute changes.  No evidence of hemorrhage.  Suspect that seizures a consequence of what by history appears to be progressive GBM.  Patient not yet back to baseline but able to follow commands.     Recommendations: 1. Keppra 1000mg  IV now with maintenance of 500mg  po BID to start this evening 2. Seizure precautions 3. Increase hydrocortisone to 20mg  BID 4. No further imaging indicated at this time.  If she does not return to premorbid status will consider EEG at that time.   5. Ativan prn seizure activity or extreme agitation  Alexis Goodell, MD Neurology [phone] 07/28/2017,  10:13 AM

## 2017-07-28 NOTE — Care Management Obs Status (Signed)
[place] NOTIFICATION   Patient Details  Name: Kelsey Woodard MRN: [id number] Date of Birth: 1951/05/15   Medicare Observation Status Notification Given:  Yes    Shelbie Ammons, RN 07/28/2017, 2:46 PM

## 2017-07-28 NOTE — ED Triage Notes (Signed)
Pt presents to ED from home by EMS with after she had an witnessed seizure; hx of the same. Last seizure was last week but husband states this one lasted a little longer. Pt husband states pt has been increasingly confused the past couple of weeks. Hx of glioblastoma with surgery earlier this year. Pt not answering questions during triage. Groaning and holding right leg. No increased work of breathing noted.

## 2017-07-29 ENCOUNTER — Observation Stay (HOSPITAL_COMMUNITY): Payer: PPO

## 2017-07-29 DIAGNOSIS — R569 Unspecified convulsions: Secondary | ICD-10-CM | POA: Diagnosis not present

## 2017-07-29 DIAGNOSIS — R531 Weakness: Secondary | ICD-10-CM | POA: Diagnosis not present

## 2017-07-29 DIAGNOSIS — E876 Hypokalemia: Secondary | ICD-10-CM | POA: Diagnosis not present

## 2017-07-29 DIAGNOSIS — C719 Malignant neoplasm of brain, unspecified: Secondary | ICD-10-CM | POA: Diagnosis not present

## 2017-07-29 LAB — CBC
HEMATOCRIT: 32.8 % — AB (ref 35.0–47.0)
Hemoglobin: 11.3 g/dL — ABNORMAL LOW (ref 12.0–16.0)
MCH: 33.1 pg (ref 26.0–34.0)
MCHC: 34.5 g/dL (ref 32.0–36.0)
MCV: 95.9 fL (ref 80.0–100.0)
Platelets: 123 10*3/uL — ABNORMAL LOW (ref 150–440)
RBC: 3.42 MIL/uL — AB (ref 3.80–5.20)
RDW: 16.2 % — ABNORMAL HIGH (ref 11.5–14.5)
WBC: 6.1 10*3/uL (ref 3.6–11.0)

## 2017-07-29 LAB — BASIC METABOLIC PANEL
Anion gap: 11 (ref 5–15)
BUN: 16 mg/dL (ref 6–20)
CHLORIDE: 107 mmol/L (ref 101–111)
CO2: 23 mmol/L (ref 22–32)
Calcium: 8.9 mg/dL (ref 8.9–10.3)
Creatinine, Ser: 0.78 mg/dL (ref 0.44–1.00)
GFR calc non Af Amer: >60 mL/min (ref >60)
Glucose, Bld: 88 mg/dL (ref 65–99)
POTASSIUM: 3.3 mmol/L — AB (ref 3.5–5.1)
SODIUM: 141 mmol/L (ref 135–145)

## 2017-07-29 MED ORDER — POTASSIUM CHLORIDE 10 MEQ/100ML IV SOLN
10.0000 meq | INTRAVENOUS | Status: AC
  Administered 2017-07-29 (×3): 10 meq via INTRAVENOUS
  Filled 2017-07-29 (×3): qty 100

## 2017-07-29 MED ORDER — POTASSIUM CHLORIDE 10 MEQ/100ML IV SOLN
10.0000 meq | INTRAVENOUS | Status: DC
  Administered 2017-07-29: 10 meq via INTRAVENOUS
  Filled 2017-07-29: qty 100

## 2017-07-29 MED ORDER — SODIUM CHLORIDE 0.9 % IV SOLN
500.0000 mg | Freq: Once | INTRAVENOUS | Status: AC
  Administered 2017-07-29: 17:00:00 500 mg via INTRAVENOUS
  Filled 2017-07-29: qty 5

## 2017-07-29 MED ORDER — LEVETIRACETAM 500 MG/5ML IV SOLN
1000.0000 mg | Freq: Two times a day (BID) | INTRAVENOUS | Status: DC
  Administered 2017-07-29 – 2017-07-30 (×2): 1000 mg via INTRAVENOUS
  Filled 2017-07-29 (×3): qty 10

## 2017-07-29 NOTE — Progress Notes (Signed)
PT Cancellation Note  Patient Details Name: Kelsey Woodard MRN: [id number] DOB: [birth year]   Cancelled Treatment:    Reason Eval/Treat Not Completed: Patient's level of consciousness.  Spoke with RN who reports pt remains too drowsy to participate with PT.  Will continue to follow acutely and will attempt to see pt again later today, schedule permitting.   Collie Siad PT, DPT 07/29/2017, 10:40 AM

## 2017-07-29 NOTE — Plan of Care (Signed)
Problem: Education: Goal: Knowledge of [facility] Education information/materials will improve Outcome: Progressing Hypoxic during shift, may have been monitor error, upon recheck w/o intervention, improved from 83% to 98% on RA.  No s/s pain, restless at times overnight.  Transferred to low bed for fall prevention.  Minimally responsive when evening meds due, would respond verbally, but would not open eyes.  Dr. Darvin Neighbours paged, PO meds changed to IV route.  Husband refused Flonase.  No other needs.  Bed in low position, bed alarm on.  Call bell within reach, Alpha.

## 2017-07-29 NOTE — Progress Notes (Addendum)
[facility] [place] NAME: Kelsey Woodard    MR#:  [id number]  DATE OF BIRTH:  January 09, 1951  SUBJECTIVE:  Pt lethargic., yawning. Not much responsive.  Husband at bedside.  No more seizures noted.  REVIEW OF SYSTEMS:   Review of Systems  Unable to perform ROS: Mental status change   Tolerating Diet:not much Tolerating PT: pending  DRUG ALLERGIES:  No Known Allergies  VITALS:  Blood pressure (!) 167/81, pulse 73, temperature 98 F (36.7 C), temperature source Oral, resp. rate 18, height 5' (1.524 m), weight 44.4 kg (97 lb 12.8 oz), SpO2 100 %.  PHYSICAL EXAMINATION:   Physical Exam limited, pt is post-ictal  GENERAL:  66 y.o.-year-old patient lying in the bed with no acute distress. Thin EYES: Pupils equal, round, reactive to light and accommodation. No scleral icterus. Extraocular muscles intact.  HEENT: Head atraumatic, normocephalic. Oropharynx and nasopharynx clear.  NECK:  Supple, no jugular venous distention. No thyroid enlargement, no tenderness.  LUNGS: Normal breath sounds bilaterally, no wheezing, rales, rhonchi. No use of accessory muscles of respiration.  CARDIOVASCULAR: S1, S2 normal. No murmurs, rubs, or gallops.  ABDOMEN: Soft, nontender, nondistended. Bowel sounds present. No organomegaly or mass.  EXTREMITIES: No cyanosis, clubbing or edema b/l.    NEUROLOGIC:moves all extremities well PSYCHIATRIC:  Lethargic SKIN: No obvious rash, lesion, or ulcer.   LABORATORY PANEL:  CBC  Recent Labs Lab 07/29/17 0937  WBC 6.1  HGB 11.3*  HCT 32.8*  PLT 123*    Chemistries   Recent Labs Lab 07/28/17 0649 07/29/17 0937  NA 140 141  K 3.3* 3.3*  CL 107 107  CO2 25 23  GLUCOSE 111* 88  BUN 17 16  CREATININE 0.89 0.78  CALCIUM 8.9 8.9  AST 50*  --   ALT 41  --   ALKPHOS 60  --   BILITOT 0.8  --    Cardiac Enzymes No results for input(s): TROPONINI in the last 168 hours. RADIOLOGY:  Dg Chest 2  View  Result Date: 07/28/2017 CLINICAL DATA:  Seizure activity ; increased confusion over the past several weeks. History of glioblastoma with surgery earlier this year. EXAM: CHEST  2 VIEW COMPARISON:  Chest x-ray of July 20, 2017 FINDINGS: The lungs are adequately inflated and clear. The heart and pulmonary vascularity are normal. The mediastinum is normal in width. There is no pleural effusion. The bony thorax is unremarkable. IMPRESSION: There is no active cardiopulmonary disease. Electronically Signed   By: David  Martinique M.D.   On: 07/28/2017 07:54   Ct Head Wo Contrast  Result Date: 07/28/2017 CLINICAL DATA:  Witnessed seizure.  History of glioblastoma. EXAM: CT HEAD WITHOUT CONTRAST TECHNIQUE: Contiguous axial images were obtained from the base of the skull through the vertex without intravenous contrast. COMPARISON:  12/01/2016 CT. FINDINGS: The patient was unable to remain motionless for the exam. Small or subtle lesions could be overlooked. Brain: No visible acute stroke, acute hemorrhage, mass lesion, hydrocephalus, or extra-axial fluid. Normal for age cerebral volume. Extensive hypoattenuation throughout the white matter, LEFT greater than RIGHT, likely post treatment effect from radiation. Encephalomalacia LEFT inferior frontal lobe, status post surgical debulking of a large tumor, reportedly glioblastoma. Vascular: Calcification of the cavernous internal carotid arteries consistent with cerebrovascular atherosclerotic disease. No signs of intracranial large vessel occlusion. Skull: LEFT frontal craniotomy defect appears uncomplicated. There is no skull fracture. Sinuses/Orbits: No acute finding. Other: None. IMPRESSION: No acute intracranial findings. Postsurgical changes  LEFT frontal lobe. Extensive hypoattenuation of the white matter, likely post treatment effect. If further investigation desired, MRI of the brain without and with contrast is recommended. This would most optimally be  performed when the patient is stable to remain motionless. Electronically Signed   By: Staci Righter M.D.   On: 07/28/2017 07:51   ASSESSMENT AND PLAN:   Kelsey Woodard  is a 66 y.o. female with a known history of glioblastoma.  She presents to the ER with her first ever major seizure.  Her husband helped her to the bathroom and back to bed.  He heard a groaning noise.  He then saw generalized jerking movements. She was also gurgling.  She did not lose urine.  She may have bit the inside of her mouth.  He states that in the past she had a few minor seizures and episodes of confusion.   1. Generalized seizure with postictal period.   -Patient has a history of glioblastoma.   -Case discussed with Dr. Doy Mince neurology. -cont IV Keppra  500 mg twice - No need to do an MRI of the brain or EEG if mental status improves.   2.  Weakness and recent hip fracture.  Physical therapy evaluation  3.  History of glioblastoma.  Surgery 12/05/2016, 6 weeks of radiation and chemo.  As per husband still has some glioblastoma there.  Reviewed CODE STATUS and patient is a full code.  4.  Hyperlipidemia unspecified on atorvastatin  5.  Hypokalemia.  Replace potassium IV.  6.  Lactic acidosis likely secondary to seizure  7.  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  Her medications have been changed to IV. D/w with husband in the room.  Case discussed with Care Management/Social Worker. Management plans discussed with the patient, family and they are in agreement.  CODE STATUS: FULL  DVT Prophylaxis: lovenox  TOTAL TIME TAKING CARE OF THIS PATIENT: *30* minutes.  >50% time spent on counselling and coordination of care  POSSIBLE D/C IN *1-2 DAYS, DEPENDING ON CLINICAL CONDITION.  Note: This dictation was prepared with Dragon dictation along with smaller phrase technology. Any transcriptional errors that result from this process are  unintentional.  Keamber Macfadden M.D on 07/29/2017 at 1:30 PM  Between 7am to 6pm - Pager - [phone]  After 6pm go to [email or web address] - password [email or web address]  Office  [phone]  CC: Primary care physician; Idelle Crouch, MD

## 2017-07-29 NOTE — Progress Notes (Signed)
Subjective: Patient lethargic.  Has not returned to premorbid functioning.    Objective: Current vital signs: BP (!) 167/81 (BP Location: Right Arm)   Pulse 73   Temp 98 F (36.7 C) (Oral)   Resp 18   Ht 5' (1.524 m)   Wt 44.4 kg (97 lb 12.8 oz)   SpO2 100%   BMI 19.10 kg/m  Vital signs in last 24 hours: Temp:  [97.3 F (36.3 C)-98.5 F (36.9 C)] 98 F (36.7 C) (10/30 1315) Pulse Rate:  [64-88] 73 (10/30 1315) Resp:  [18] 18 (10/30 1315) BP: (145-167)/(81-95) 167/81 (10/30 1315) SpO2:  [83 %-100 %] 100 % (10/30 1315)  Intake/Output from previous day: 10/29 0701 - 10/30 0700 In: 920 [I.V.:715; IV Piggyback:205] Out: -  Intake/Output this shift: No intake/output data recorded. Nutritional status: Diet regular Room service appropriate? Yes; Fluid consistency: Thin  Neurologic Exam: Mental Status: Lethargic.  No speech.  Does not follow commands.   Cranial Nerves: II: Discs flat bilaterally; Pupils equal, round, reactive to light and accommodation III,IV, VI: ptosis not present, intact oculocephalic maneuver V,VII: grimace symmetric VIII: unable to test IX,X: unable to test XI: unable to test XII: unable to test Motor: Movement noted in upper extremities spontaneously.     Lab Results: Basic Metabolic Panel:  Recent Labs Lab 07/28/17 0649 07/29/17 0937  NA 140 141  K 3.3* 3.3*  CL 107 107  CO2 25 23  GLUCOSE 111* 88  BUN 17 16  CREATININE 0.89 0.78  CALCIUM 8.9 8.9    Liver Function Tests:  Recent Labs Lab 07/28/17 0649  AST 50*  ALT 41  ALKPHOS 60  BILITOT 0.8  PROT 6.4*  ALBUMIN 3.6   No results for input(s): LIPASE, AMYLASE in the last 168 hours. No results for input(s): AMMONIA in the last 168 hours.  CBC:  Recent Labs Lab 07/28/17 0649 07/29/17 0937  WBC 3.9 6.1  NEUTROABS 3.0  --   HGB 11.6* 11.3*  HCT 33.8* 32.8*  MCV 97.0 95.9  PLT 129* 123*    Cardiac Enzymes: No results for input(s): CKTOTAL, CKMB, CKMBINDEX,  TROPONINI in the last 168 hours.  Lipid Panel: No results for input(s): CHOL, TRIG, HDL, CHOLHDL, VLDL, LDLCALC in the last 168 hours.  CBG: No results for input(s): GLUCAP in the last 168 hours.  Microbiology: No results found for this or any previous visit.  Coagulation Studies: No results for input(s): LABPROT, INR in the last 72 hours.  Imaging: Dg Chest 2 View  Result Date: 07/28/2017 CLINICAL DATA:  Seizure activity ; increased confusion over the past several weeks. History of glioblastoma with surgery earlier this year. EXAM: CHEST  2 VIEW COMPARISON:  Chest x-ray of July 20, 2017 FINDINGS: The lungs are adequately inflated and clear. The heart and pulmonary vascularity are normal. The mediastinum is normal in width. There is no pleural effusion. The bony thorax is unremarkable. IMPRESSION: There is no active cardiopulmonary disease. Electronically Signed   By: David  Martinique M.D.   On: 07/28/2017 07:54   Ct Head Wo Contrast  Result Date: 07/28/2017 CLINICAL DATA:  Witnessed seizure.  History of glioblastoma. EXAM: CT HEAD WITHOUT CONTRAST TECHNIQUE: Contiguous axial images were obtained from the base of the skull through the vertex without intravenous contrast. COMPARISON:  12/01/2016 CT. FINDINGS: The patient was unable to remain motionless for the exam. Small or subtle lesions could be overlooked. Brain: No visible acute stroke, acute hemorrhage, mass lesion, hydrocephalus, or extra-axial fluid. Normal for  age cerebral volume. Extensive hypoattenuation throughout the white matter, LEFT greater than RIGHT, likely post treatment effect from radiation. Encephalomalacia LEFT inferior frontal lobe, status post surgical debulking of a large tumor, reportedly glioblastoma. Vascular: Calcification of the cavernous internal carotid arteries consistent with cerebrovascular atherosclerotic disease. No signs of intracranial large vessel occlusion. Skull: LEFT frontal craniotomy defect appears  uncomplicated. There is no skull fracture. Sinuses/Orbits: No acute finding. Other: None. IMPRESSION: No acute intracranial findings. Postsurgical changes LEFT frontal lobe. Extensive hypoattenuation of the white matter, likely post treatment effect. If further investigation desired, MRI of the brain without and with contrast is recommended. This would most optimally be performed when the patient is stable to remain motionless. Electronically Signed   By: Staci Righter M.D.   On: 07/28/2017 07:51    Medications:  I have reviewed the patient's current medications. Scheduled: . atorvastatin  10 mg Oral Daily  . enoxaparin (LOVENOX) injection  30 mg Subcutaneous Q24H  . fluticasone  1-2 spray Each Nare BID  . hydrocortisone sod succinate (SOLU-CORTEF) inj  50 mg Intravenous Daily  . loratadine  10 mg Oral Daily  . multivitamin  1 tablet Oral Daily    Assessment/Plan: Patient lethargic.  Unclear if related to [place] or if patient with subclinical seizure activity.  On Keppra 500mg  BID.  Recommendations: 1.  EEG stat with further recommendations concerning medications based on results.     LOS: 0 days   Alexis Goodell, MD Neurology [phone] 07/29/2017  1:57 PM   Addendum: EEG of the brain reviewed and shows slowing in the left frontal region with frequent sharp transients.  Additional 500mg  of Keppra given.  Will increase maintenance to 1000mg  IV q 12 hours.   Continue seizure precautions.   Alexis Goodell, MD Neurology [phone]

## 2017-07-30 DIAGNOSIS — E785 Hyperlipidemia, unspecified: Secondary | ICD-10-CM | POA: Diagnosis present

## 2017-07-30 DIAGNOSIS — R569 Unspecified convulsions: Secondary | ICD-10-CM

## 2017-07-30 DIAGNOSIS — Z8349 Family history of other endocrine, nutritional and metabolic diseases: Secondary | ICD-10-CM | POA: Diagnosis not present

## 2017-07-30 DIAGNOSIS — E876 Hypokalemia: Secondary | ICD-10-CM | POA: Diagnosis present

## 2017-07-30 DIAGNOSIS — D6959 Other secondary thrombocytopenia: Secondary | ICD-10-CM | POA: Diagnosis present

## 2017-07-30 DIAGNOSIS — E872 Acidosis: Secondary | ICD-10-CM | POA: Diagnosis present

## 2017-07-30 DIAGNOSIS — Z9221 Personal history of antineoplastic chemotherapy: Secondary | ICD-10-CM | POA: Diagnosis not present

## 2017-07-30 DIAGNOSIS — I341 Nonrheumatic mitral (valve) prolapse: Secondary | ICD-10-CM | POA: Diagnosis present

## 2017-07-30 DIAGNOSIS — Z803 Family history of malignant neoplasm of breast: Secondary | ICD-10-CM | POA: Diagnosis not present

## 2017-07-30 DIAGNOSIS — Z90710 Acquired absence of both cervix and uterus: Secondary | ICD-10-CM | POA: Diagnosis not present

## 2017-07-30 DIAGNOSIS — Z8249 Family history of ischemic heart disease and other diseases of the circulatory system: Secondary | ICD-10-CM | POA: Diagnosis not present

## 2017-07-30 DIAGNOSIS — C719 Malignant neoplasm of brain, unspecified: Secondary | ICD-10-CM | POA: Diagnosis present

## 2017-07-30 DIAGNOSIS — Z923 Personal history of irradiation: Secondary | ICD-10-CM | POA: Diagnosis not present

## 2017-07-30 DIAGNOSIS — T451X5A Adverse effect of antineoplastic and immunosuppressive drugs, initial encounter: Secondary | ICD-10-CM | POA: Diagnosis present

## 2017-07-30 MED ORDER — LEVETIRACETAM 500 MG PO TABS
1000.0000 mg | ORAL_TABLET | Freq: Two times a day (BID) | ORAL | Status: DC
  Administered 2017-07-30 – 2017-07-31 (×2): 1000 mg via ORAL
  Filled 2017-07-30 (×3): qty 2

## 2017-07-30 MED ORDER — BISACODYL 10 MG RE SUPP
10.0000 mg | Freq: Once | RECTAL | Status: AC
  Administered 2017-07-30: 10 mg via RECTAL
  Filled 2017-07-30: qty 1

## 2017-07-30 MED ORDER — ENSURE ENLIVE PO LIQD
237.0000 mL | Freq: Two times a day (BID) | ORAL | Status: DC
  Administered 2017-07-31: 11:00:00 237 mL via ORAL

## 2017-07-30 NOTE — Progress Notes (Signed)
Subjective: Patient improved today.  Awake and talking.    Objective: Current vital signs: BP (!) 179/94 (BP Location: Left Arm) Comment: Pt moved around a lot during reading  Pulse 72   Temp 97.8 F (36.6 C) (Axillary)   Resp 18   Ht 5' (1.524 m)   Wt 44.4 kg (97 lb 12.8 oz)   SpO2 100%   BMI 19.10 kg/m  Vital signs in last 24 hours: Temp:  [97.8 F (36.6 C)-99.8 F (37.7 C)] 97.8 F (36.6 C) (10/31 0811) Pulse Rate:  [70-79] 72 (10/31 0811) Resp:  [17-18] 18 (10/31 0811) BP: (148-179)/(65-94) 179/94 (10/31 0811) SpO2:  [98 %-100 %] 100 % (10/31 0811)  Intake/Output from previous day: 10/30 0701 - 10/31 0700 In: 815 [I.V.:337; IV Piggyback:478] Out: -  Intake/Output this shift: No intake/output data recorded. Nutritional status: Diet regular Room service appropriate? Yes; Fluid consistency: Thin  Neurologic Exam: Mental Status: Alert.  Speech fluent.  Follows some simple commands but does not follow all commands.   Cranial Nerves: II: Discs flat bilaterally; Pupils equal, round, reactive to light and accommodation III,IV, VI: left ptosis, EOMI V,VII: smile symmetric VIII: grossly normal bilaterally IX,X: unable to test XI: bilateral shoulder shrug XII: midline tongue extension Motor: Movement noted in upper extremities spontaneously but does not move the lower extremities to command.     Lab Results: Basic Metabolic Panel:  Recent Labs Lab 07/28/17 0649 07/29/17 0937  NA 140 141  K 3.3* 3.3*  CL 107 107  CO2 25 23  GLUCOSE 111* 88  BUN 17 16  CREATININE 0.89 0.78  CALCIUM 8.9 8.9    Liver Function Tests:  Recent Labs Lab 07/28/17 0649  AST 50*  ALT 41  ALKPHOS 60  BILITOT 0.8  PROT 6.4*  ALBUMIN 3.6   No results for input(s): LIPASE, AMYLASE in the last 168 hours. No results for input(s): AMMONIA in the last 168 hours.  CBC:  Recent Labs Lab 07/28/17 0649 07/29/17 0937  WBC 3.9 6.1  NEUTROABS 3.0  --   HGB 11.6* 11.3*  HCT  33.8* 32.8*  MCV 97.0 95.9  PLT 129* 123*    Cardiac Enzymes: No results for input(s): CKTOTAL, CKMB, CKMBINDEX, TROPONINI in the last 168 hours.  Lipid Panel: No results for input(s): CHOL, TRIG, HDL, CHOLHDL, VLDL, LDLCALC in the last 168 hours.  CBG: No results for input(s): GLUCAP in the last 168 hours.  Microbiology: No results found for this or any previous visit.  Coagulation Studies: No results for input(s): LABPROT, INR in the last 72 hours.  Imaging: No results found.  Medications:  I have reviewed the patient's current medications. Scheduled: . atorvastatin  10 mg Oral Daily  . enoxaparin (LOVENOX) injection  30 mg Subcutaneous Q24H  . fluticasone  1-2 spray Each Nare BID  . hydrocortisone sod succinate (SOLU-CORTEF) inj  50 mg Intravenous Daily  . loratadine  10 mg Oral Daily  . multivitamin  1 tablet Oral Daily    Assessment/Plan: Patient improved today.  Remains on increased dose of Solucortef.  Due to EEG results, Keppra dose increased.  Patient appears to be tolerating well.    Recommendations: 1.  Will change Keppra dose to po 2.  Continue seizure precautions   LOS: 0 days   Alexis Goodell, MD Neurology [phone] 07/30/2017  2:28 PM

## 2017-07-30 NOTE — Progress Notes (Signed)
[place] [facility] [place] NAME: Kelsey Woodard    MR#:  [id number]  DATE OF BIRTH:  24-Jul-1951  SUBJECTIVE:  Pt opening eyes today. Ate a few bites last nite  Husband at bedside.  No more seizures noted. Got extra loading dose of Keppra last pm  REVIEW OF SYSTEMS:   Review of Systems  Unable to perform ROS: Mental status change   Tolerating Diet:not much Tolerating PT: pending  DRUG ALLERGIES:  No Known Allergies  VITALS:  Blood pressure (!) 148/65, pulse 70, temperature 99.8 F (37.7 C), temperature source Oral, resp. rate 17, height 5' (1.524 m), weight 44.4 kg (97 lb 12.8 oz), SpO2 98 %.  PHYSICAL EXAMINATION:   Physical Exam limited, pt is post-ictal  GENERAL:  66 y.o.-year-old patient lying in the bed with no acute distress. Thin EYES: Pupils equal, round, reactive to light and accommodation. No scleral icterus. Extraocular muscles intact.  HEENT: Head atraumatic, normocephalic. Oropharynx and nasopharynx clear.  NECK:  Supple, no jugular venous distention. No thyroid enlargement, no tenderness.  LUNGS: Normal breath sounds bilaterally, no wheezing, rales, rhonchi. No use of accessory muscles of respiration.  CARDIOVASCULAR: S1, S2 normal. No murmurs, rubs, or gallops.  ABDOMEN: Soft, nontender, nondistended. Bowel sounds present. No organomegaly or mass.  EXTREMITIES: No cyanosis, clubbing or edema b/l.    NEUROLOGIC:moves all extremities well PSYCHIATRIC:  Lethargic but opening eyes today SKIN: No obvious rash, lesion, or ulcer.   LABORATORY PANEL:  CBC  Recent Labs Lab 07/29/17 0937  WBC 6.1  HGB 11.3*  HCT 32.8*  PLT 123*    Chemistries   Recent Labs Lab 07/28/17 0649 07/29/17 0937  NA 140 141  K 3.3* 3.3*  CL 107 107  CO2 25 23  GLUCOSE 111* 88  BUN 17 16  CREATININE 0.89 0.78  CALCIUM 8.9 8.9  AST 50*  --   ALT 41  --   ALKPHOS 60  --   BILITOT 0.8  --    Cardiac Enzymes No results for  input(s): TROPONINI in the last 168 hours. RADIOLOGY:  Dg Chest 2 View  Result Date: 07/28/2017 CLINICAL DATA:  Seizure activity ; increased confusion over the past several weeks. History of glioblastoma with surgery earlier this year. EXAM: CHEST  2 VIEW COMPARISON:  Chest x-ray of July 20, 2017 FINDINGS: The lungs are adequately inflated and clear. The heart and pulmonary vascularity are normal. The mediastinum is normal in width. There is no pleural effusion. The bony thorax is unremarkable. IMPRESSION: There is no active cardiopulmonary disease. Electronically Signed   By: David  Martinique M.D.   On: 07/28/2017 07:54   ASSESSMENT AND PLAN:   Kelsey Woodard  is a 66 y.o. female with a known history of glioblastoma.  She presents to the ER with her first ever major seizure.  Her husband helped her to the bathroom and back to bed.  He heard a groaning noise.  He then saw generalized jerking movements. She was also gurgling.  She did not lose urine.  She may have bit the inside of her mouth.  He states that in the past she had a few minor seizures and episodes of confusion.   1. Generalized seizure with postictal lethargy.   -Patient has a history of glioblastoma multiforme -Case discussed with Dr. Doy Mince neurology. -Increased IV Keppra  1000 mg twice and received 500 mg extra loading dose -EEG showed some spike left frontal lobe (brain tumor site)  2.  Weakness and recent hip fracture.   Physical therapy evaluation  3.  History of glioblastoma . -  Surgery 12/05/2016, 6 weeks of radiation and chemo.  As per husband,pt still has some glioblastoma   Reviewed CODE STATUS and patient is a full code.  4.  Hyperlipidemia unspecified on atorvastatin  5.  Hypokalemia.  Replace potassium IV.  6.  Lactic acidosis likely secondary to seizure  7.  Thrombocytopenia secondary to chemo effect  The patient currently is quite sedated and lethargic.  Her medications have been changed to IV. D/w with  husband in the room.  Case discussed with Care Management/Social Worker. Management plans discussed with the patient, family and they are in agreement.  CODE STATUS: FULL  DVT Prophylaxis: lovenox  TOTAL TIME TAKING CARE OF THIS PATIENT: *30* minutes.  >50% time spent on counselling and coordination of care  POSSIBLE D/C IN *1-2 DAYS, DEPENDING ON CLINICAL CONDITION.  Note: This dictation was prepared with Dragon dictation along with smaller phrase technology. Any transcriptional errors that result from this process are unintentional.  Shambhavi Salley M.D on 07/30/2017 at 7:47 AM  Between 7am to 6pm - Pager - [phone]  After 6pm go to [email or web address] - password [email or web address] [facility]  Office  [phone]  CC: Primary care physician; Idelle Crouch, MD

## 2017-07-30 NOTE — Clinical Social Work Placement (Signed)
   CLINICAL SOCIAL WORK PLACEMENT  NOTE  Date:  07/30/2017  Patient Details  Name: Kelsey Woodard MRN: [id number] Date of Birth: 27-Nov-1950  Clinical Social Work is seeking post-discharge placement for this patient at the [place] level of care (*CSW will initial, date and re-position this form in  chart as items are completed):  Yes   Patient/family provided with [facility] Work Department's list of facilities offering this level of care within the geographic area requested by the patient (or if unable, by the patient's family).  Yes   Patient/family informed of their freedom to choose among providers that offer the needed level of care, that participate in Medicare, Medicaid or managed care program needed by the patient, have an available bed and are willing to accept the patient.  Yes   Patient/family informed of [facility]'s ownership interest in [facility] and [facility], as well as of the fact that they are under no obligation to receive care at these facilities.  PASRR submitted to EDS on       PASRR number received on       Existing PASRR number confirmed on 07/30/17     FL2 transmitted to all facilities in geographic area requested by pt/family on 07/30/17     FL2 transmitted to all facilities within larger geographic area on       Patient informed that his/her managed care company has contracts with or will negotiate with certain facilities, including the following:            Patient/family informed of bed offers received.  Patient chooses bed at       Physician recommends and patient chooses bed at      Patient to be transferred to   on  .  Patient to be transferred to facility by       Patient family notified on   of transfer.  Name of family member notified:        PHYSICIAN       Additional Comment:    _______________________________________________ Gilbert Narain, [name], LCSW 07/30/2017, 1:03 PM

## 2017-07-30 NOTE — NC FL2 (Signed)
[place] LEVEL OF CARE SCREENING TOOL     IDENTIFICATION  Patient Name: Kelsey Woodard Birthdate: 1951/07/14 Sex: female Admission Date (Current Location): 07/28/2017  [place] and [place] Number:  Engineering geologist and Address:  [facility], [address] [place], [place] [address]      Provider Number: [id number]  Attending Physician Name and Address:  Fritzi Mandes, MD  Relative Name and Phone Number:       Current Level of Care: Hospital Recommended Level of Care: [place] Prior Approval Number:    Date Approved/Denied:   PASRR Number:  [id number] )  Discharge Plan: SNF    Current Diagnoses: Patient Active Problem List   Diagnosis Date Noted  . Seizures ([place]) 07/30/2017  . Seizure ([place]) 07/28/2017  . Closed left hip fracture ([place]) 07/20/2017  . Hyperlipidemia LDL goal < 100 01/02/2012  . Seasonal allergies 01/02/2012  . Atrophic vaginitis 01/02/2012  . Vaccine for VZV (varicella-zoster virus) 01/02/2012    Orientation RESPIRATION BLADDER Height & Weight     Self  Normal Incontinent Weight: 97 lb 12.8 oz (44.4 kg) Height:  5' (152.4 cm)  BEHAVIORAL SYMPTOMS/MOOD NEUROLOGICAL BOWEL NUTRITION STATUS    Convulsions/Seizures Continent Diet (Diet: Regular )  AMBULATORY STATUS COMMUNICATION OF NEEDS Skin   Extensive Assist Verbally Surgical wounds (07/20/17: Incision Left Hip. )                       Personal Care Assistance Level of Assistance  Bathing, Feeding, Dressing Bathing Assistance: Limited assistance Feeding assistance: Limited assistance Dressing Assistance: Limited assistance     Functional Limitations Info  Sight, Hearing, Speech Sight Info: Adequate Hearing Info: Adequate Speech Info: Adequate    SPECIAL CARE FACTORS FREQUENCY  PT (By licensed PT), OT (By licensed OT)     PT Frequency:  (5) OT Frequency:  (5)            Contractures      Additional Factors Info   Code Status, Allergies  Chemo Treatment at [place]:  (Full Code. ) Allergies Info:  (No Known Allergies. )           Current Medications (07/30/2017):  This is the current hospital active medication list Current Facility-Administered Medications  Medication Dose Route Frequency Provider Last Rate Last Dose  . acetaminophen (TYLENOL) tablet 650 mg  650 mg Oral Q6H PRN Loletha Grayer, MD       Or  . acetaminophen (TYLENOL) suppository 650 mg  650 mg Rectal Q6H PRN Wieting, Richard, MD      . atorvastatin (LIPITOR) tablet 10 mg  10 mg Oral Daily Loletha Grayer, MD   10 mg at 07/30/17 1033  . enoxaparin (LOVENOX) injection 30 mg  30 mg Subcutaneous Q24H Loletha Grayer, MD   30 mg at 07/30/17 1035  . fluticasone (FLONASE) 50 MCG/ACT nasal spray 1-2 spray  1-2 spray Each Nare BID Loletha Grayer, MD   2 spray at 07/30/17 1034  . hydrocortisone sodium succinate (SOLU-CORTEF) 100 MG injection 50 mg  50 mg Intravenous Daily Hillary Bow, MD   50 mg at 07/29/17 2217  . levETIRAcetam (KEPPRA) 1,000 mg in sodium chloride 0.9 % 100 mL IVPB  1,000 mg Intravenous Q12H Alexis Goodell, MD   Stopped at 07/30/17 1126  . loratadine (CLARITIN) tablet 10 mg  10 mg Oral Daily Loletha Grayer, MD   10 mg at 07/30/17 1033  . LORazepam (ATIVAN)  tablet 0.5 mg  0.5 mg Oral Q6H PRN Loletha Grayer, MD   0.5 mg at 07/28/17 1837  . multivitamin (RENA-VIT) tablet 1 tablet  1 tablet Oral Daily Loletha Grayer, MD   1 tablet at 07/30/17 1033  . ondansetron (ZOFRAN) tablet 4 mg  4 mg Oral Q6H PRN Loletha Grayer, MD       Or  . ondansetron [facility]) injection 4 mg  4 mg Intravenous Q6H PRN Loletha Grayer, MD   4 mg at 07/28/17 1841  . traMADol (ULTRAM) tablet 50 mg  50 mg Oral Q6H PRN Loletha Grayer, MD         Discharge Medications: Please see discharge summary for a list of discharge medications.  Relevant Imaging Results:  Relevant Lab Results:   Additional Information  (SSN:  [id number])  Stephfon Bovey, [name], LCSW

## 2017-07-30 NOTE — Clinical Social Work Note (Signed)
Clinical Social Work Assessment  Patient Details  Name: Kelsey Woodard MRN: [id number] Date of Birth: Mar 01, 1951  Date of referral:  07/30/17               Reason for consult:  Facility Placement                Permission sought to share information with:  Chartered certified accountant granted to share information::  Yes, Verbal Permission Granted  Name::      [place]::   [place]   Relationship::     Contact Information:     Housing/Transportation Living arrangements for the past 2 months:  Single Family Home Source of Information:  Adult Children, Spouse Patient Interpreter Needed:  None Criminal Activity/Legal Involvement Pertinent to Current Situation/Hospitalization:  No - Comment as needed Significant Relationships:  Adult Children, Spouse Lives with:  Spouse Do you feel safe going back to the place where you live?  Yes Need for family participation in patient care:  Yes (Comment)  Care giving concerns:  Patient lives in [place] with her husband Kelsey Woodard [phone].    Social Worker assessment / plan:  Holiday representative (CSW) reviewed chart and noted that PT is recommending SNF. Patient is a readmission from home and had a hip pinning on 07/20/17 and went home with home health. CSW met with patient and her husband Kelsey and daughter Kelsey Woodard [phone] was at bedside. CSW is familiar with patient and family from last admission on the ortho floor. CSW introduced self and explained role of CSW department. Patient did not participate in assessment. Per husband it has been difficult taking care of her at home and the chemo treatment at [facility] has been put on hold until she can get stronger. CSW explained SNF process and that [employer] insurance will have to approve SNF. Husband is agreeable to SNF search in [place]. FL2 complete and faxed out. Health Team case manager aware of above. CSW will continue to follow and assist as  needed.   Employment status:  Disabled (Comment on whether or not currently receiving Disability), Retired Nurse, adult PT Recommendations:  [place] / Referral to community resources:  [place]  Patient/Family's Response to care:  Patient's husband is agreeable to [facility] in [place].   Patient/Family's Understanding of and Emotional Response to Diagnosis, Current Treatment, and Prognosis:  Patient's husband was very pleasant and thanked CSW for assistance.   Emotional Assessment Appearance:  Appears stated age Attitude/Demeanor/Rapport:  Unable to Assess Affect (typically observed):  Unable to Assess Orientation:  Fluctuating Orientation (Suspected and/or reported Sundowners), Oriented to Self Alcohol / Substance use:  Not Applicable Psych involvement (Current and /or in the community):  No (Comment)  Discharge Needs  Concerns to be addressed:  Discharge Planning Concerns Readmission within the last 30 days:  No Current discharge risk:  Dependent with Mobility, Chronically ill Barriers to Discharge:  Continued Medical Work up   [employer], [name], LCSW 07/30/2017, 1:04 PM

## 2017-07-30 NOTE — Progress Notes (Signed)
Initial Nutrition Assessment  DOCUMENTATION CODES:   Not applicable  INTERVENTION:  Provide Ensure Enlive po BID, each supplement provides 350 kcal and 20 grams of protein. Patient prefers vanilla.  Unsure why patient is on Rena-vite daily. Consider discontinuing and providing regular multivitamin with minerals daily.  NUTRITION DIAGNOSIS:   Inadequate oral intake related to poor appetite, acute illness (seizure, recent hip fracture s/p pinning) as evidenced by per patient/family report.  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   Patient unable to provide history during assessment. Husband was present. He reports patient has had a poor appetite the past week after her hip surgery and now after her seizure. She was only eating bites. He does endorse that she was able to eat two meals so far today. For breakfast she had 75% of her eggs and sausage. She ate 75% of her burger for lunch and some of her fries. At home she occasionally drinks Fairlife protein shake. Husband reports that when patient initially began treatment for her glioblastoma she had a decreased appetite and weight loss, but her appetite had really picked up and she was gaining weight back and eating well.  UBW 103 lbs. Husband reports patient lost down to as low as 86 lbs after starting treatment. She has now gained up to 97.8 lbs. He reports she has not had any weight loss this week from her acutely  decreased intake. No true weight was taken on recent admission for surgery (appear to be reported weights) so unable to trend acute weight.  Meal Completion: 0-60%  Medications reviewed and include: Keppra, Rena-vite daily.  Labs reviewed: Potassium 3.3.  Discussed with RN.  Patient does not meet criteria for malnutrition at this time. If she is unable to meet her needs she will be at risk for developing malnutrition as she is highly catabolic in setting of the glioblastoma undergoing treatment.  NUTRITION - FOCUSED PHYSICAL EXAM:    Most Recent Value  Orbital Region  No depletion  Upper Arm Region  Mild depletion  Thoracic and Lumbar Region  No depletion  Buccal Region  No depletion  Temple Region  No depletion  Clavicle Bone Region  No depletion  Clavicle and Acromion Bone Region  No depletion  Scapular Bone Region  No depletion  Dorsal Hand  No depletion  Patellar Region  No depletion  Anterior Thigh Region  No depletion  Posterior Calf Region  No depletion  Edema (RD Assessment)  None  Hair  Reviewed  Eyes  Unable to assess  Mouth  Unable to assess  Skin  Reviewed  Nails  Reviewed      Diet Order:  Diet regular Room service appropriate? Yes; Fluid consistency: Thin  EDUCATION NEEDS:   No education needs have been identified at this time  Skin:  Skin Assessment: Skin Integrity Issues: Skin Integrity Issues:: Incisions Incisions: closed incision left hip  Last BM:  07/30/2017 - medium type 4  Height:   Ht Readings from Last  1 Encounters:  07/28/17 5' (1.524 m)    Weight:   Wt Readings from Last 1 Encounters:  07/28/17 97 lb 12.8 oz (44.4 kg)    Ideal Body Weight:  45.5 kg  BMI:  Body mass index is 19.1 kg/m.  Estimated Nutritional Needs:   Kcal:  1330-1550 (30-35 kcal/kg)  Protein:  60-70 grams (1.3-1.6 grams/kg)  Fluid:  1.3-1.5 L/day (30-35 mL/kg)  Willey Blade, MS, RD, LDN Office: [phone] Pager: [phone] After Hours/Weekend  Pager: [phone]

## 2017-07-30 NOTE — Evaluation (Signed)
Physical Therapy Evaluation Patient Details Name: Kelsey Woodard MRN: [id number] DOB: 12-08-1950 Today's Date: 07/30/2017   History of Present Illness  Pt is a 66 y/o F who presented following major seizure.  Pt s/p L hip pinning 07/20/17 following fall.  Pt is WBAT.  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      Clinical Impression  Pt admitted with above diagnosis. Pt currently with functional limitations due to the deficits listed below (see PT Problem List). [name] presented initially very lethargic but was able to participate in OOB mobility today.  She has much difficulty following commands to complete therapeutic exercises so focus was functional mobility this session.  She currently requires mod assist for bed mobility and min assist for sit<>stand transfers. She was unable to focus on task with attempted ambulation and requires assist from this PT to advance LLE to take several steps at bedside.  Given pt's current mobility status, recommending SNF at d/c.  Pt will benefit from skilled PT to increase their independence and safety with mobility to allow discharge to the venue listed below.      Follow Up Recommendations SNF    Equipment Recommendations  Other (comment) (To be determined at next venue of care; possibly WC)    Recommendations for Other Services       Precautions / Restrictions Precautions Precautions: Fall;Other (comment) Precaution Comments: Monitor BP Restrictions Weight Bearing Restrictions: Yes LLE Weight Bearing: Weight bearing as tolerated      Mobility  Bed Mobility Overal bed mobility: Needs Assistance Bed Mobility: Supine to Sit;Sit to Supine     Supine to sit: Mod assist;HOB elevated Sit to supine: Mod assist   General bed mobility comments: Cues and hand over hand along with assist to elevate trunk to achieve sitting.  Cues provided for pt to scoot to EOB.  To return to supine she requires assist to bring  BLEs into bed.  Transfers Overall transfer level: Needs assistance Equipment used: Rolling walker (2 wheeled) Transfers: Sit to/from Stand Sit to Stand: Min assist         General transfer comment: Pt does not demonstrate proper hand placement and safe technique despite verbal cues.  Assist to boost to standing and to control descent to sit.    Ambulation/Gait Ambulation/Gait assistance: Mod assist Ambulation Distance (Feet): 4 Feet Assistive device: Rolling walker (2 wheeled) Gait Pattern/deviations: Trunk flexed;Narrow base of support;Antalgic   Gait velocity interpretation: <1.8 ft/sec, indicative of risk for recurrent falls General Gait Details: Pt provided with max verbal and tactile cues to take one step forward and three steps to her L using RW.  This PT had to advance LLE each time as pt unable to weight shift completely and pick up L foot from floor.  Pt shuffles R foot rather than achieving foot clearance.  Pt intermittently lethargic, shutting her eyes and requiring cues to attend to task at hand.   Stairs            Wheelchair Mobility    Modified Rankin (Stroke Patients Only)       Balance Overall balance assessment: Needs assistance Sitting-balance support: Single extremity supported;Feet supported Sitting balance-Leahy Scale: Poor Sitting balance - Comments: Pt relies on UE support for static sitting EOB   Standing balance support: Bilateral upper extremity supported;During functional activity Standing balance-Leahy Scale: Poor Standing balance comment: Pt relies on RW and outside physical assist for support with static and dynamic activities  Pertinent Vitals/Pain Pain Assessment: Faces Faces Pain Scale: Hurts little more Pain Location: LLE with mobility and RUE with squeeze of BP cuff Pain Descriptors / Indicators: Grimacing;Guarding;Moaning Pain Intervention(s): Limited activity within patient's  tolerance;Monitored during session    Uncertain expects to be discharged to:: Private residence Living Arrangements: Spouse/significant other Available Help at Discharge: Family Type of Home: House Home Access: Stairs to enter Entrance Stairs-Rails: Right Entrance Stairs-Number of Steps: 2 Home Layout: One level Home Equipment: Environmental consultant - 2 wheels;Bedside commode;Tub bench Additional Comments: Husband works outside of home, but is willing/able to stay with patient, coordinate 24/7 upon discharge as needed.    Prior Function Level of Independence: Needs assistance   Gait / Transfers Assistance Needed: Pt was ambulating household distances with assist and use of RW.    ADL's / Homemaking Assistance Needed: Pt required assist for sponge bathing, dressing.  Husband does the cooking, cleaning, driving.          Hand Dominance        Extremity/Trunk Assessment   Upper Extremity Assessment Upper Extremity Assessment:  (BUE strength grossly 3/5)    Lower Extremity Assessment Lower Extremity Assessment:  (BLE strength grossly 3/5)       Communication   Communication: No difficulties  Cognition Arousal/Alertness: Awake/alert;Lethargic Behavior During Therapy: WFL for tasks assessed/performed Overall Cognitive Status: Impaired/Different from baseline Area of Impairment: Orientation;Attention;Memory;Following commands;Safety/judgement;Awareness;Problem solving                 Orientation Level: Disoriented to;Situation;Time;Place Current Attention Level: Sustained Memory: Decreased short-term memory Following Commands: Follows one step commands inconsistently;Follows one step commands with increased time Safety/Judgement: Decreased awareness of safety;Decreased awareness of deficits Awareness: Intellectual Problem Solving: Decreased initiation;Slow processing;Difficulty sequencing;Requires verbal cues;Requires tactile cues General Comments: Pt initially  very lethargic and requires stimulation or her name spoken to open her eyes.  Once not stimulated she closes her eyes.   She intermittent follows cues to complete thearpeutic exercise but requires commands repeated multiple times.  In sitting and standing the pt keeps her eyes open the majority of the time but intermittent closes them, requiring commands to open them and to redirect to task at hand.       General Comments General comments (skin integrity, edema, etc.): BP taken in LUE supine at start of session: 187/110, taken again in RUE: 171/87, and taken once more at end of session in supine in RUE: 165/91.  RN notified of elevated BP readings.     Exercises General Exercises - Lower Extremity Short Arc Quad: Other (comment) (attempted but pt unable to follow commands to complete) Hip ABduction/ADduction: PROM;AAROM;Left;10 reps;Supine Straight Leg Raises: AAROM;Left;PROM;10 reps;Supine Other Exercises Other Exercises: Encouraged family to keep light on during the day and to have pt sitting up during the day and talking with family as much as pt can tolerate.   Assessment/Plan    PT Assessment Patient needs continued PT services  PT Problem List Decreased strength;Decreased range of motion;Decreased activity tolerance;Decreased balance;Decreased mobility;Decreased cognition;Decreased safety awareness;Decreased knowledge of use of DME;Pain       PT Treatment Interventions DME instruction;Gait training;Stair training;Functional mobility training;Therapeutic activities;Therapeutic exercise;Balance training;Neuromuscular re-education;Cognitive remediation;Patient/family education;Wheelchair mobility training;Modalities    PT Goals (Current goals can be found in the Care Plan section)  Acute Rehab PT Goals Patient Stated Goal: pt unable to state but per husband, to get stronger and more independent PT Goal Formulation: With patient/family Time For Goal Achievement: 08/13/17 Potential to  Achieve Goals: Fair  Frequency Min 2X/week   Barriers to discharge Decreased caregiver support Husband unable to provide level of physical assist the pt currently requires    Co-evaluation               AM-PAC PT "6 Clicks" Daily Activity  Outcome Measure Difficulty turning over in bed (including adjusting bedclothes, sheets and blankets)?: Unable Difficulty moving from lying on back to sitting on the side of the bed? : Unable Difficulty sitting down on and standing up from a chair with arms (e.g., wheelchair, bedside commode, etc,.)?: Unable Help needed moving to and from a bed to chair (including a wheelchair)?: A Lot Help needed walking in hospital room?: A Lot Help needed climbing 3-5 steps with a railing? : Total 6 Click Score: 8    End of Session Equipment Utilized During Treatment: Gait belt Activity Tolerance: Patient limited by fatigue;Patient limited by lethargy;Patient tolerated treatment well Patient left: in bed;with call bell/phone within reach;with bed alarm set;with family/visitor present Nurse Communication: Mobility status;Other (comment) (BP readings) PT Visit Diagnosis: Muscle weakness (generalized) (M62.81);Difficulty in walking, not elsewhere classified (R26.2);Other abnormalities of gait and mobility (R26.89);Unsteadiness on feet (R26.81);Pain Pain - Right/Left: Left Pain - part of body: Hip    Time: 0850-0928 PT Time Calculation (min) (ACUTE ONLY): 38 min   Charges:   PT Evaluation $PT Eval Moderate Complexity: 1 Mod PT Treatments $Therapeutic Activity: 23-37 mins   PT G Codes:   PT G-Codes **NOT FOR INPATIENT CLASS** Functional Assessment Tool Used: AM-PAC 6 Clicks Basic Mobility;Clinical judgement Functional Limitation: Mobility: Walking and moving around Mobility: Walking and Moving Around Current Status ([id number]): At least 80 percent but less than 100 percent impaired, limited or restricted Mobility: Walking and Moving Around Goal Status  [phone]): At least 40 percent but less than 60 percent impaired, limited or restricted    Collie Siad PT, DPT 07/30/2017, 11:38 AM

## 2017-07-30 NOTE — Plan of Care (Signed)
Problem: Education: Goal: Knowledge of [facility] Education information/materials will improve Outcome: Progressing VSS, free of falls during shift.  No s/s pain, no needs.  More restful sleep overnight vs. previous night.  Low bed in low position, call bell within reach.  WCTM.

## 2017-07-30 NOTE — Progress Notes (Signed)
Clinical Social Worker (CSW) presented bed offers to patient's husband Jan. Per husband he will review offers and get back to CSW.  [employer], LCSW [phone]

## 2017-07-31 DIAGNOSIS — M810 Age-related osteoporosis without current pathological fracture: Secondary | ICD-10-CM | POA: Diagnosis not present

## 2017-07-31 DIAGNOSIS — S7292XD Unspecified fracture of left femur, subsequent encounter for closed fracture with routine healing: Secondary | ICD-10-CM | POA: Diagnosis not present

## 2017-07-31 DIAGNOSIS — C719 Malignant neoplasm of brain, unspecified: Secondary | ICD-10-CM | POA: Diagnosis not present

## 2017-07-31 DIAGNOSIS — Z7401 Bed confinement status: Secondary | ICD-10-CM | POA: Diagnosis not present

## 2017-07-31 DIAGNOSIS — E876 Hypokalemia: Secondary | ICD-10-CM | POA: Diagnosis not present

## 2017-07-31 DIAGNOSIS — R569 Unspecified convulsions: Secondary | ICD-10-CM | POA: Diagnosis not present

## 2017-07-31 DIAGNOSIS — D6959 Other secondary thrombocytopenia: Secondary | ICD-10-CM | POA: Diagnosis not present

## 2017-07-31 DIAGNOSIS — G40909 Epilepsy, unspecified, not intractable, without status epilepticus: Secondary | ICD-10-CM | POA: Diagnosis not present

## 2017-07-31 DIAGNOSIS — E2740 Unspecified adrenocortical insufficiency: Secondary | ICD-10-CM | POA: Diagnosis not present

## 2017-07-31 DIAGNOSIS — E875 Hyperkalemia: Secondary | ICD-10-CM | POA: Diagnosis not present

## 2017-07-31 DIAGNOSIS — Z7982 Long term (current) use of aspirin: Secondary | ICD-10-CM | POA: Diagnosis not present

## 2017-07-31 DIAGNOSIS — R4182 Altered mental status, unspecified: Secondary | ICD-10-CM | POA: Diagnosis not present

## 2017-07-31 DIAGNOSIS — R531 Weakness: Secondary | ICD-10-CM | POA: Diagnosis not present

## 2017-07-31 DIAGNOSIS — M25552 Pain in left hip: Secondary | ICD-10-CM | POA: Diagnosis not present

## 2017-07-31 MED ORDER — ENSURE ENLIVE PO LIQD: 237.0000 mL | Freq: Two times a day (BID) | ORAL | 12 refills | Status: DC

## 2017-07-31 MED ORDER — POTASSIUM CHLORIDE 20 MEQ PO PACK
40.0000 meq | PACK | Freq: Once | ORAL | Status: AC
  Administered 2017-07-31: 40 meq via ORAL
  Filled 2017-07-31: qty 2

## 2017-07-31 MED ORDER — LEVETIRACETAM 1000 MG PO TABS: 1000.0000 mg | ORAL_TABLET | Freq: Two times a day (BID) | ORAL | 0 refills | Status: DC

## 2017-07-31 MED ORDER — HYDROCORTISONE 10 MG PO TABS
30.0000 mg | ORAL_TABLET | Freq: Every day | ORAL | Status: DC
  Administered 2017-07-31: 10:00:00 30 mg via ORAL
  Filled 2017-07-31: qty 3

## 2017-07-31 NOTE — Discharge Instructions (Signed)
    Epilepsy Epilepsy is when a person keeps having seizures. A seizure is unusual activity in the brain. A seizure can change how you think or behave, and it can make it hard to be aware of what is happening. This condition can cause problems, such as:  Falls, accidents, and injury.  Depression.  Poor memory.  Sudden unexplained death in epilepsy (SUDEP). This is rare. Its cause is not known. Most people with epilepsy lead normal lives. Follow these instructions at home: Medicines  Take medicines only as told by your doctor.  Avoid anything that may keep your medicine from working, such as alcohol. Activity  Get enough rest. Lack of sleep can make seizures more likely to occur.  Follow your doctor's advice about driving, swimming, and doing anything else that would be dangerous if you had a seizure. Teaching others  Teach friends and family what to do if you have a seizure. They should:  Lay you on the ground to prevent a fall.  Cushion your head and body.  Loosen any tight clothing around your neck.  Turn you on your side.  Stay with you until you are better.  Not hold you down.  Not put anything in your mouth.  Know whether or not you need emergency care. General instructions  Avoid anything that causes you to have seizures.  Keep a seizure diary. Write down what you remember about each seizure, and especially what might have caused it.  Keep all follow-up visits as told by your doctor. This is important. Contact a doctor if:  You have a change in your seizure pattern.  You get an infection or start to feel sick. You may have more seizures when you are sick. Get help right away if:  A seizure does not stop after 5 minutes.  You have more than one seizure in a row, and you do not have enough time between the seizures to feel better.  A seizure makes it harder to breathe.  A seizure is different from other seizures you have had.  A seizure makes you  unable to speak or use a part of your body.  You did not wake up right after a seizure. This information is not intended to replace advice given to you by your health care provider. Make sure you discuss any questions you have with your health care provider. Document Released: 07/14/2009 Document Revised: 04/22/2016 Document Reviewed: 03/26/2016 Elsevier Interactive Patient Education  2017 [employer]  

## 2017-07-31 NOTE — Discharge Summary (Signed)
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 Endometriosis   . Glioblastoma ([place])   . Hyperlipidemia   . Mitral valve prolapse     HOSPITAL COURSE:  Kelsey Woodard a 66 y.o.femalewith a known history of glioblastoma. She presents to the ER with her first ever major seizure. Her husband helped her to the bathroom and back to bed. He heard a groaning noise. He then saw generalized jerking movements. She was also gurgling. She did not lose urine. She may have bit the inside of her mouth. He states that in the past she had a few minor seizures and episodes of confusion.   1.Generalized seizure with postictal lethargy---now improved -Patient has a history of glioblastoma multiforme -Appreciate Dr. Doy Mince neurology input -Increased IVKeppra  1000 mg twice and received 500 mg extra loading dose---now on Keppra oral 1000 milligrams twice daily -EEG showed some spike left frontal lobe (brain tumor site)   2. Weakness and recent hip fracture.  Physical therapy evaluation--- recommends rehab.  Family has chosen [facility]  3. History of glioblastoma . - Surgery 12/05/2016, 6 weeks of radiation and chemo. As per husband,pt still has some glioblastoma  Reviewed CODE STATUS and patient is a full code.  4. Hyperlipidemia unspecified on atorvastatin  5. Hypokalemia.  Replete  6. Lactic acidosis likely secondary to seizure  7. Thrombocytopenia secondary to chemo  effect  Overall mental status improved DC to rehab today Outpatient follow-up with [facility]--- has been to make appointment  CONSULTS OBTAINED:  Treatment Team:  Catarina Hartshorn, MD  DRUG ALLERGIES:  No Known Allergies  DISCHARGE MEDICATIONS:   Current Discharge Medication List    START taking these medications   Details  feeding supplement, ENSURE ENLIVE, (ENSURE ENLIVE) LIQD Take 237 mLs by mouth 2 (two) times daily between meals. Qty: 237 mL, Refills: 12    levETIRAcetam (KEPPRA) 1000 MG tablet Take 1 tablet (1,000 mg total) by mouth 2 (two) times daily. Qty: 60 tablet, Refills: 0      CONTINUE these medications which have NOT CHANGED   Details  atorvastatin (LIPITOR) 20 MG tablet Take 0.5 tablets (10 mg total) by mouth daily. Qty: 90 tablet, Refills: 3   Associated Diagnoses: Hyperlipidemia LDL goal < 100    b complex vitamins tablet Take 1 tablet by mouth daily.    enoxaparin (LOVENOX) 30 MG/0.3ML injection Inject 0.3 mLs (30 mg total) into the skin daily. Qty: 14 Syringe, Refills: 0    fluticasone (FLONASE) 50 MCG/ACT nasal spray Place 1-2 sprays into both nostrils 2 (two) times daily.    hydrocortisone (CORTEF) 10 MG tablet Take 30 mg by mouth daily.    cetirizine (ZYRTEC) 10 MG tablet TAKE 1 TABLET (10 MG TOTAL) BY MOUTH DAILY. Qty: 30 tablet, Refills: 2    conjugated estrogens (PREMARIN) vaginal cream Place 4.13 Applicatorfuls vaginally 2 (two) times a week. Or as directed Qty: 42.5 g, Refills: 6   Associated Diagnoses: Atrophic vaginitis    traMADol (ULTRAM) 50 MG tablet Take 1  tablet (50 mg total) by mouth every 6 (six) hours as needed for moderate pain. Qty: 30 tablet, Refills: 0        If you experience worsening of your admission symptoms, develop shortness of breath, life threatening emergency, suicidal or homicidal thoughts you must seek medical attention immediately by calling 911 or calling your MD immediately  if symptoms less  severe.  You Must read complete instructions/literature along with all the possible adverse reactions/side effects for all the Medicines you take and that have been prescribed to you. Take any new Medicines after you have completely understood and accept all the possible adverse reactions/side effects.   Please note  You were cared for by a hospitalist during your hospital stay. If you have any questions about your discharge medications or the care you received while you were in the hospital after you are discharged, you can call the unit and asked to speak with the hospitalist on call if the hospitalist that took care of you is not available. Once you are discharged, your primary care physician will handle any further medical issues. Please note that NO REFILLS for any discharge medications will be authorized once you are discharged, as it is imperative that you return to your primary care physician (or establish a relationship with a primary care physician if you do not have one) for your aftercare needs so that they can reassess your need for medications and monitor your lab values. Today   SUBJECTIVE   More awake alert answering questions Family in the room.  VITAL SIGNS:  Blood pressure (!) 158/82, pulse 67, temperature 98.4 F (36.9 C), temperature source Oral, resp. rate 16, height 5' (1.524 m), weight 44.4 kg (97 lb 12.8 oz), SpO2 98 %.  I/O:  No intake or output data in the 24 hours ending 07/31/17 1041  PHYSICAL EXAMINATION:  GENERAL:  66 y.o.-year-old patient lying in the bed with no acute distress.  EYES: Pupils equal, round, reactive to light and accommodation. No scleral icterus. Extraocular muscles intact.  HEENT: Head atraumatic, normocephalic. Oropharynx and nasopharynx clear.  NECK:  Supple, no jugular venous distention. No thyroid enlargement, no tenderness.  LUNGS: Normal breath sounds bilaterally, no wheezing, rales,rhonchi or crepitation. No use of accessory muscles of  respiration.  CARDIOVASCULAR: S1, S2 normal. No murmurs, rubs, or gallops.  ABDOMEN: Soft, non-tender, non-distended. Bowel sounds present. No organomegaly or mass.  EXTREMITIES: No pedal edema, cyanosis, or clubbing.  NEUROLOGIC: No focal weakness.  Overall weak. PSYCHIATRIC: The patient is alert and oriented x 3.  SKIN: No obvious rash, lesion, or ulcer.   DATA REVIEW:   CBC   Recent Labs Lab 07/29/17 0937  WBC 6.1  HGB 11.3*  HCT 32.8*  PLT 123*    Chemistries   Recent Labs Lab 07/28/17 0649 07/29/17 0937  NA 140 141  K 3.3* 3.3*  CL 107 107  CO2 25 23  GLUCOSE 111* 88  BUN 17 16  CREATININE 0.89 0.78  CALCIUM 8.9 8.9  AST 50*  --   ALT 41  --   ALKPHOS 60  --   BILITOT 0.8  --     Microbiology Results   No results found for this or any previous visit (from the past 240 hour(s)).  RADIOLOGY:  No results found.   Management plans discussed with the patient, family and they are in agreement.  CODE STATUS:     Code Status Orders        Start  Ordered   07/28/17 1036  Full code  Continuous     07/28/17 1036    Code Status History    Date Active Date Inactive Code Status Order ID Comments User Context   07/20/2017  6:18 PM 07/23/2017  5:07 PM Full Code [id number]  Henreitta Leber, MD Inpatient      TOTAL TIME TAKING CARE OF THIS PATIENT: *40* minutes.    Siren Porrata M.D on 07/31/2017 at 10:41 AM  Between 7am to 6pm - Pager - [phone] After 6pm go to [email or web address] - password [email or web address] [facility]  Office  [phone]  CC: Primary care physician; Idelle Crouch, MD

## 2017-07-31 NOTE — Progress Notes (Signed)
Subjective: Patient out of bed today in the chair.  She is feeding herself breakfast.  She is more alert and communicative.  No seizures noted overnight.  Patient remains on Keppra.  Objective: Current vital signs: BP (!) 158/82 (BP Location: Left Arm)   Pulse 67   Temp 98.4 F (36.9 C) (Oral)   Resp 16   Ht 5' (1.524 m)   Wt 44.4 kg (97 lb 12.8 oz)   SpO2 98%   BMI 19.10 kg/m  Vital signs in last 24 hours: Temp:  [97.9 F (36.6 C)-98.7 F (37.1 C)] 98.4 F (36.9 C) (11/01 0754) Pulse Rate:  [67-82] 67 (11/01 0754) Resp:  [16-19] 16 (11/01 0754) BP: (148-158)/(58-82) 158/82 (11/01 0754) SpO2:  [98 %-100 %] 98 % (11/01 0545)  Intake/Output from previous day: No intake/output data recorded. Intake/Output this shift: No intake/output data recorded. Nutritional status: Diet regular Room service appropriate? Yes; Fluid consistency: Thin  Neurologic Exam: Alert.  Speech fluent. Follows some simple commands but does not follow all commands.  Cranial Nerves: II: Discs flat bilaterally; Pupils equal, round, reactive to light and accommodation III,IV, VI: left ptosis, EOMI V,VII: smilesymmetric VIII: grossly normal bilaterally IX,X: Gag intact XI: bilateral shoulder shrug XII: midline tongue extension Motor: Patient moving both upper extremities symmetrically.  No focal weakness noted.  Lab Results: Basic Metabolic Panel:  Recent Labs Lab 07/28/17 0649 07/29/17 0937  NA 140 141  K 3.3* 3.3*  CL 107 107  CO2 25 23  GLUCOSE 111* 88  BUN 17 16  CREATININE 0.89 0.78  CALCIUM 8.9 8.9    Liver Function Tests:  Recent Labs Lab 07/28/17 0649  AST 50*  ALT 41  ALKPHOS 60  BILITOT 0.8  PROT 6.4*  ALBUMIN 3.6   No results for input(s): LIPASE, AMYLASE in the last 168 hours. No results for input(s): AMMONIA in the last 168 hours.  CBC:  Recent Labs Lab 07/28/17 0649 07/29/17 0937  WBC 3.9 6.1  NEUTROABS 3.0  --   HGB 11.6* 11.3*  HCT 33.8* 32.8*   MCV 97.0 95.9  PLT 129* 123*    Cardiac Enzymes: No results for input(s): CKTOTAL, CKMB, CKMBINDEX, TROPONINI in the last 168 hours.  Lipid Panel: No results for input(s): CHOL, TRIG, HDL, CHOLHDL, VLDL, LDLCALC in the last 168 hours.  CBG: No results for input(s): GLUCAP in the last 168 hours.  Microbiology: No results found for this or any previous visit.  Coagulation Studies: No results for input(s): LABPROT, INR in the last 72 hours.  Imaging: No results found.  Medications:  I have reviewed the patient's current medications. Scheduled: . atorvastatin  10 mg Oral Daily  . feeding supplement (ENSURE ENLIVE)  237 mL Oral BID BM  . fluticasone  1-2 spray Each Nare BID  . hydrocortisone  30 mg Oral Q breakfast  . levETIRAcetam  1,000 mg Oral BID  . loratadine  10 mg Oral Daily  . multivitamin  1 tablet Oral Daily    Assessment/Plan: Patient is done well overnight.  No further seizures noted.  More alert today.  Tolerating Keppra without noted side effects.  Recommendations: 1.  Patient to remain on Keppra at 1000 mg p.o. twice daily 2.  Patient to continue follow-up with neurology and neurosurgeon as an outpatient. 3.  No further neurologic intervention is recommended at this time.  If further questions arise, please call or page at that time.  Thank you for allowing neurology to participate in the care of  this patient.    LOS: 1 day   Alexis Goodell, MD Neurology [phone] 07/31/2017  1:19 PM

## 2017-07-31 NOTE — Progress Notes (Signed)
Medications administered by student RN [id number] with supervision of Clinical Instructor Ting Cage MSN, RN-BC or patient's assigned RN.   

## 2017-07-31 NOTE — Progress Notes (Signed)
Pt to be discharged to [facility] via [facility]. [facility] has been notified. Alert/ no resp distress. Report called to angela walker at [facility].  Sl d/cd. Waiting for ems

## 2017-07-31 NOTE — Progress Notes (Signed)
Clinical Social Worker (CSW) met with patient this morning and her husband Kelsey Woodard and daughter Kelsey Woodard were at bedside. CSW asked about SNF choice. Husband and daughter chose [facility].  Patient is medically stable for D/C to [facility] today. [facility] authorization has been received, auth # [id number]. Per [facility] admissions coordinator at [facility] patient can come today to room 410. RN will call report to 400 hall RN and arrange EMS for transport. CSW sent D/C orders to [facility] via [facility]. Patient is aware of above. Patient's husband Kelsey Woodard is at bedside and aware of above. Please reconsult if future social work needs arise. CSW signing off.   [employer], LCSW [phone]

## 2017-07-31 NOTE — Progress Notes (Signed)
Ems arrived  To transport pt to  [facility]  No voiced c/o.

## 2017-07-31 NOTE — Progress Notes (Signed)
Pt to be discharged to  Skilled facility. Alert. No resp distress. No s/s/ of seizure activity. Chair today more alert and responsive today.

## 2017-07-31 NOTE — Clinical Social Work Placement (Signed)
   CLINICAL SOCIAL WORK PLACEMENT  NOTE  Date:  07/31/2017  Patient Details  Name: Kelsey Woodard MRN: [id number] Date of Birth: [birth year]  Clinical Social Work is seeking post-discharge placement for this patient at the [place] level of care (*CSW will initial, date and re-position this form in  chart as items are completed):  Yes   Patient/family provided with [place] Work Department's list of facilities offering this level of care within the geographic area requested by the patient (or if unable, by the patient's family).  Yes   Patient/family informed of their freedom to choose among providers that offer the needed level of care, that participate in Medicare, Medicaid or managed care program needed by the patient, have an available bed and are willing to accept the patient.  Yes   Patient/family informed of [place]'s ownership interest in [facility] and [facility], as well as of the fact that they are under no obligation to receive care at these facilities.  PASRR submitted to EDS on       PASRR number received on       Existing PASRR number confirmed on 07/30/17     FL2 transmitted to all facilities in geographic area requested by pt/family on 07/30/17     FL2 transmitted to all facilities within larger geographic area on       Patient informed that his/her managed care company has contracts with or will negotiate with certain facilities, including the following:        Yes   Patient/family informed of bed offers received.  Patient chooses bed at  [facility] )     Physician recommends and patient chooses bed at      Patient to be transferred to  [facility] ) on 07/31/17.  Patient to be transferred to facility by  [facility] )     Patient family notified on 07/31/17 of transfer.  Name of family member notified:   (Patient's husband Jan is at bedside and aware of D/C today. )     PHYSICIAN        Additional Comment:    _______________________________________________ Offie Pickron, [name], LCSW 07/31/2017, 2:18 PM

## 2017-08-04 DIAGNOSIS — C719 Malignant neoplasm of brain, unspecified: Secondary | ICD-10-CM | POA: Diagnosis not present

## 2017-08-04 DIAGNOSIS — G40909 Epilepsy, unspecified, not intractable, without status epilepticus: Secondary | ICD-10-CM | POA: Diagnosis not present

## 2017-08-04 DIAGNOSIS — M810 Age-related osteoporosis without current pathological fracture: Secondary | ICD-10-CM | POA: Diagnosis not present

## 2017-08-05 DIAGNOSIS — M25552 Pain in left hip: Secondary | ICD-10-CM | POA: Diagnosis not present

## 2017-08-11 ENCOUNTER — Other Ambulatory Visit: Payer: Self-pay | Admitting: *Deleted

## 2017-08-11 DIAGNOSIS — C711 Malignant neoplasm of frontal lobe: Secondary | ICD-10-CM

## 2017-08-11 NOTE — Patient Outreach (Signed)
[facility]) Care Management  08/11/2017  Alana Dayton Sprague 05/03/1951 [id number]   Met with Milana Kidney, SW at facility.  She reports that patient needs a lot of care and plan is to go home, but therapy has recommended possible ALF to family.  Patient is married, spouse works.   Met with patient, spouse and daughter at bedside.  Spouse reports that he would like for patient to go home. He would like some extra help in the home, asking if [facility] provides home care. RNCM reviewed [facility] care management services. Patient has [employer] Medicare plan.  Patient and spouse would like a [facility] LCSW to review community resources such as home care providers with them.  Written consent obtained.   Plan to refer to [facility] LCSW to follow while at facility and to assist with community resources such as home care providers.  Royetta Crochet. Laymond Purser, RN, BSN, [place] [phone]) Business Cell  [phone]) Toll Free Office

## 2017-08-14 ENCOUNTER — Encounter: Payer: Self-pay | Admitting: *Deleted

## 2017-08-14 ENCOUNTER — Other Ambulatory Visit: Payer: Self-pay | Admitting: *Deleted

## 2017-08-14 ENCOUNTER — Ambulatory Visit: Payer: Self-pay | Admitting: *Deleted

## 2017-08-14 NOTE — Patient Outreach (Unsigned)
[facility]) Care Management  Kelsey Woodard  08/14/2017   Kelsey Woodard 13-Jul-1951 [id number]  Subjective:   Objective:   Encounter Medications:  Outpatient Encounter Medications as of 08/14/2017  Medication Sig  . atorvastatin (LIPITOR) 20 MG tablet Take 0.5 tablets (10 mg total) by mouth daily.  Kelsey Woodard b complex vitamins tablet Take 1 tablet by mouth daily.  . cetirizine (ZYRTEC) 10 MG tablet TAKE 1 TABLET (10 MG TOTAL) BY MOUTH DAILY.  Kelsey Woodard conjugated estrogens (PREMARIN) vaginal cream Place 0.73 Applicatorfuls vaginally 2 (two) times a week. Or as directed (Patient not taking: Reported on 07/28/2017)  . enoxaparin (LOVENOX) 30 MG/0.3ML injection Inject 0.3 mLs (30 mg total) into the skin daily.  . feeding supplement, ENSURE ENLIVE, (ENSURE ENLIVE) LIQD Take 237 mLs by mouth 2 (two) times daily between meals.  . fluticasone (FLONASE) 50 MCG/ACT nasal spray Place 1-2 sprays into both nostrils 2 (two) times daily.  . hydrocortisone (CORTEF) 10 MG tablet Take 30 mg by mouth daily.  Kelsey Woodard levETIRAcetam (KEPPRA) 1000 MG tablet Take 1 tablet (1,000 mg total) by mouth 2 (two) times daily.  . traMADol (ULTRAM) 50 MG tablet Take 1 tablet (50 mg total) by mouth every 6 (six) hours as needed for moderate pain.   No facility-administered encounter medications on file as of 08/14/2017.     Functional Status:  In your present state of health, do you have any difficulty performing the following activities: 07/28/2017 07/20/2017  Hearing? N -  Vision? N -  Difficulty concentrating or making decisions? Y -  Walking or climbing stairs? Y -  Dressing or bathing? Y -  Doing errands, shopping? Y N  Some recent data might be hidden    Fall/Depression Screening: No flowsheet data found. No flowsheet data found.  Assessment:   Plan:

## 2017-08-14 NOTE — Patient Outreach (Signed)
[facility]) Care Management  08/14/2017  Odem 04/13/1951 [id number]   Patient referred to this social worker to assist with community resources related ti in home assistance following her discharge from [facility]. This social worker spoke to patient's spouse Jan who stated that patient would be discharging home tomorrow, he is not sure if she will need HH or not. Patient's spouse is not as concerned about assistance with ADL's, he has purchased a transfer shower chair for the tub and a hand held shower hose.  He is confident that he can manage her medications as well. His concern is about supervision when she is  home alone due to a history of  falls, confusion and becoming disoriented,  Per patient's spouse, he has limited family support outside of his daughter and has already contacted [facility] home health. He has arranged to pay out of pocket for them to come 3 days a week for 4 hours to supervise patient while he is at work.  Per patient's spouse, he is not sure at this time if he still needs [facility] involvement but requested a call on Monday afternoon to discuss [facility] care management services further.    Sheralyn Boatman [facility] Care Management [phone]

## 2017-08-18 ENCOUNTER — Other Ambulatory Visit: Payer: Self-pay | Admitting: *Deleted

## 2017-08-19 NOTE — Patient Outreach (Signed)
[facility]) Care Management  08/19/2017  Amberle Lyter Linan [birth year] [id number]   Late Entry-Phone call to patient's spouse on 08/17/17 to discuss involvement choice regarding [facility] care management services. Patient was referred to this social worker to assist with in home care community resources. Per patient's spouse, patient discharged from [facility] on 08/14/17.  He has hired a company called [facility] to assist with custodial care 3 times per week for 4 hours while he returns to work. Patient's daughter also helpful with care in the evenings as well. Per patient's spouse, the aid came today and he is very pleased with the care exhibited at this time.  Per patient's spouse, since patient fractured her hip she had not been able to continue with her chemotherapy appointments, however they have an appointment at [facility] on Wednesday for a MRI and he is expecting the chemoptherapy to resume by the end of the month. Patient's spouse verbalized no additional community resource needs at this time. He states that he is happy with [facility] at this time and he is looking forward to patient resuming her treatments at [facility]. Patient to be closed to [facility] care management at this time.   Sheralyn Boatman [facility] Care Management [phone]

## 2017-08-20 DIAGNOSIS — R4181 Age-related cognitive decline: Secondary | ICD-10-CM | POA: Diagnosis not present

## 2017-08-20 DIAGNOSIS — Z5112 Encounter for antineoplastic immunotherapy: Secondary | ICD-10-CM | POA: Diagnosis not present

## 2017-08-20 DIAGNOSIS — C711 Malignant neoplasm of frontal lobe: Secondary | ICD-10-CM | POA: Diagnosis not present

## 2017-08-27 ENCOUNTER — Encounter: Payer: Self-pay | Admitting: *Deleted

## 2017-08-27 DIAGNOSIS — Z967 Presence of other bone and tendon implants: Secondary | ICD-10-CM | POA: Diagnosis not present

## 2017-08-27 DIAGNOSIS — Z8781 Personal history of (healed) traumatic fracture: Secondary | ICD-10-CM | POA: Diagnosis not present

## 2017-08-28 DIAGNOSIS — C711 Malignant neoplasm of frontal lobe: Secondary | ICD-10-CM | POA: Diagnosis not present

## 2017-09-26 DIAGNOSIS — Z967 Presence of other bone and tendon implants: Secondary | ICD-10-CM | POA: Diagnosis not present

## 2017-09-26 DIAGNOSIS — Z8781 Personal history of (healed) traumatic fracture: Secondary | ICD-10-CM | POA: Diagnosis not present

## 2017-09-26 DIAGNOSIS — M25552 Pain in left hip: Secondary | ICD-10-CM | POA: Diagnosis not present

## 2017-10-02 ENCOUNTER — Other Ambulatory Visit: Payer: Self-pay | Admitting: *Deleted

## 2017-10-02 NOTE — Patient Outreach (Signed)
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 [name] denies any current problems. She is living at home now, discharged from [facility] in November. She says she has not had any siezures in 2 months. She is to follow up with the neurologist in 2 more months.  Pt was unable to answer some questions as she did not know the answer or seemed to be confused by the question. She denies memory issues but as evidenced by our conversation she does have a memory deficit. She has a family member with her currently but says she does stay at home alone occasionally. She also says she is working some.  I reminded her that I am associated with the LCSW who called her several times last fall, [employer]. I encouraged her to call us if she has any needs that we can help her with.  Kelsey Woodard. Myrtie Neither, MSN, [facility] Gerontological Nurse Practitioner [facility] Care Management [phone]

## 2018-02-28 DEATH — deceased

## 2018-07-04 IMAGING — CR DG CHEST 2V
2 series · 2 of 2 positions shown · non-contrast
Comparison: Chest x-ray of July 20, 2017

CLINICAL DATA: Seizure activity ; increased confusion over the past
several weeks. History of glioblastoma with surgery earlier this
year.

EXAM:
CHEST  2 VIEW

[chest lat]
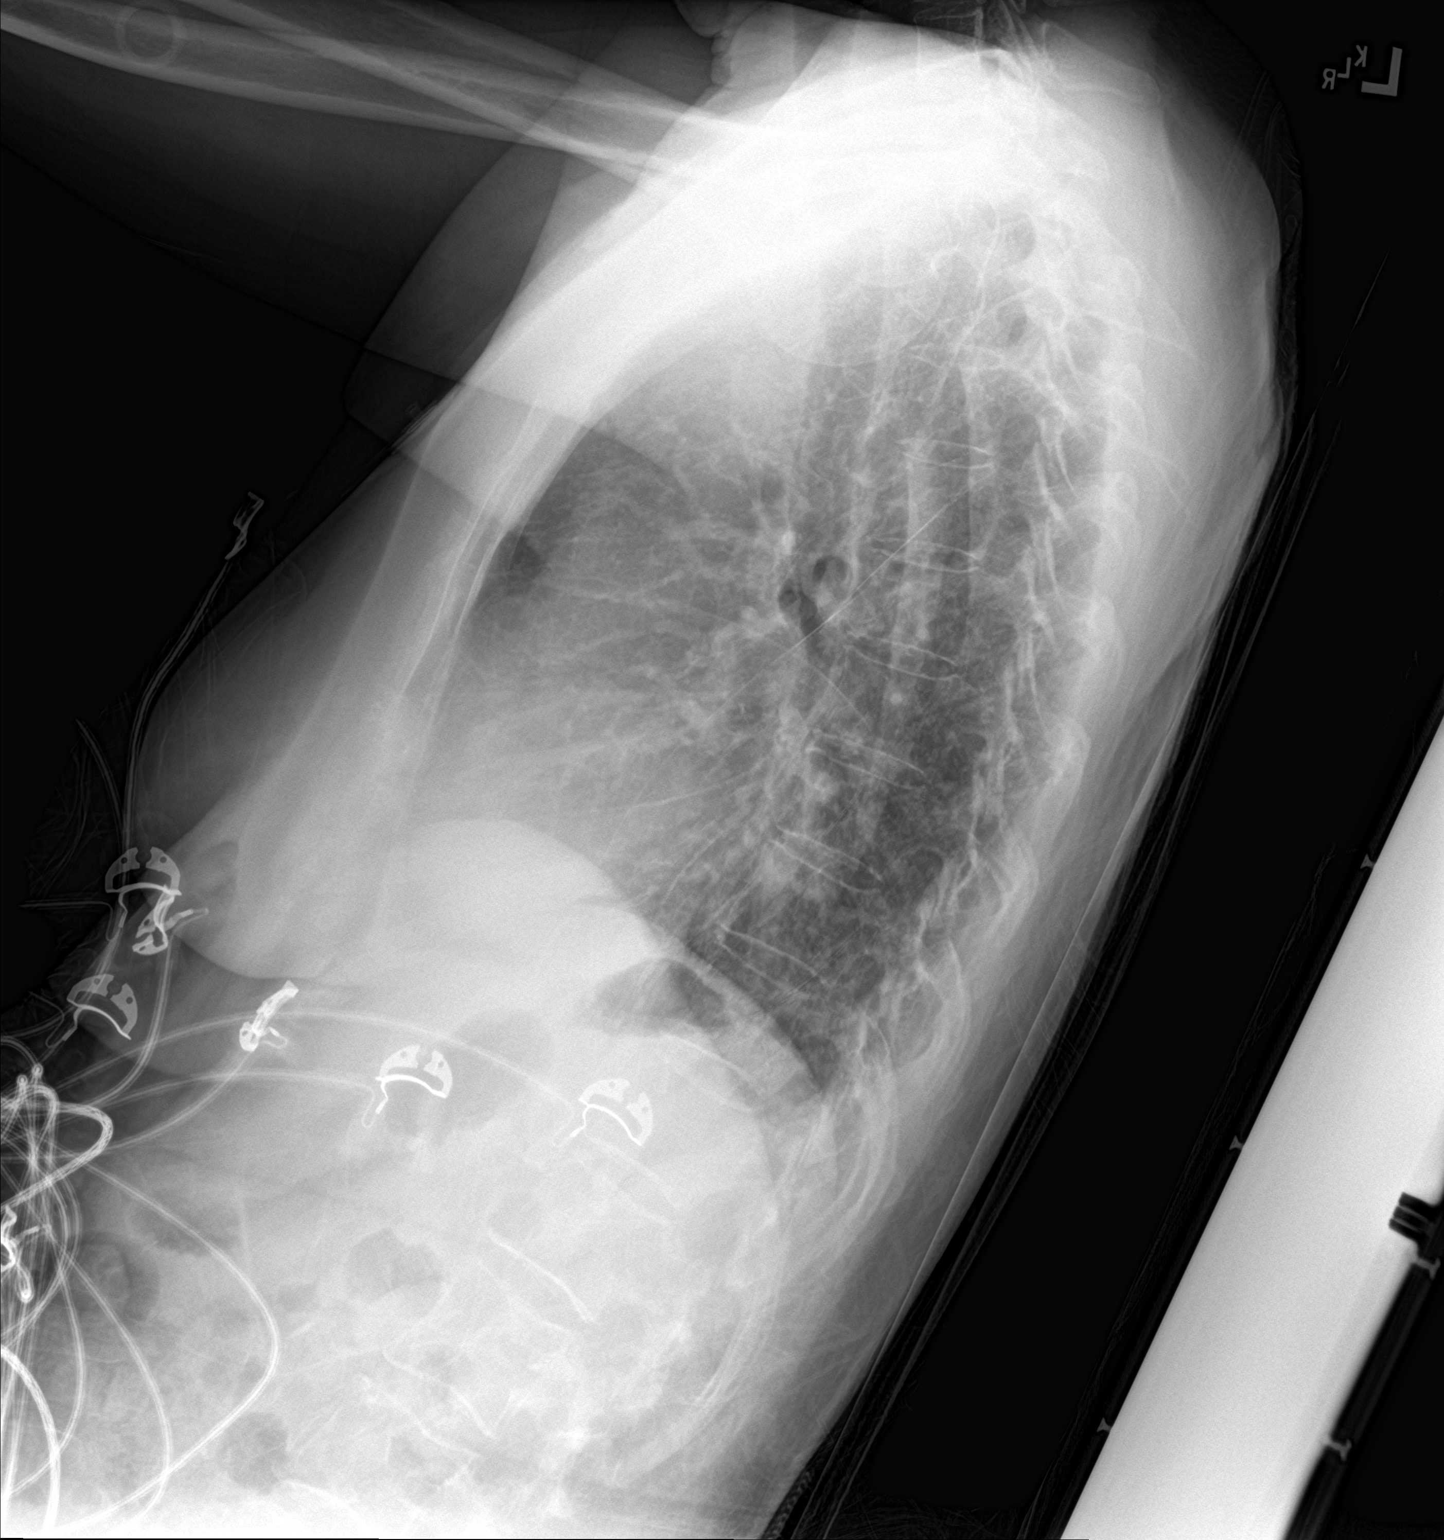

[chest ap]
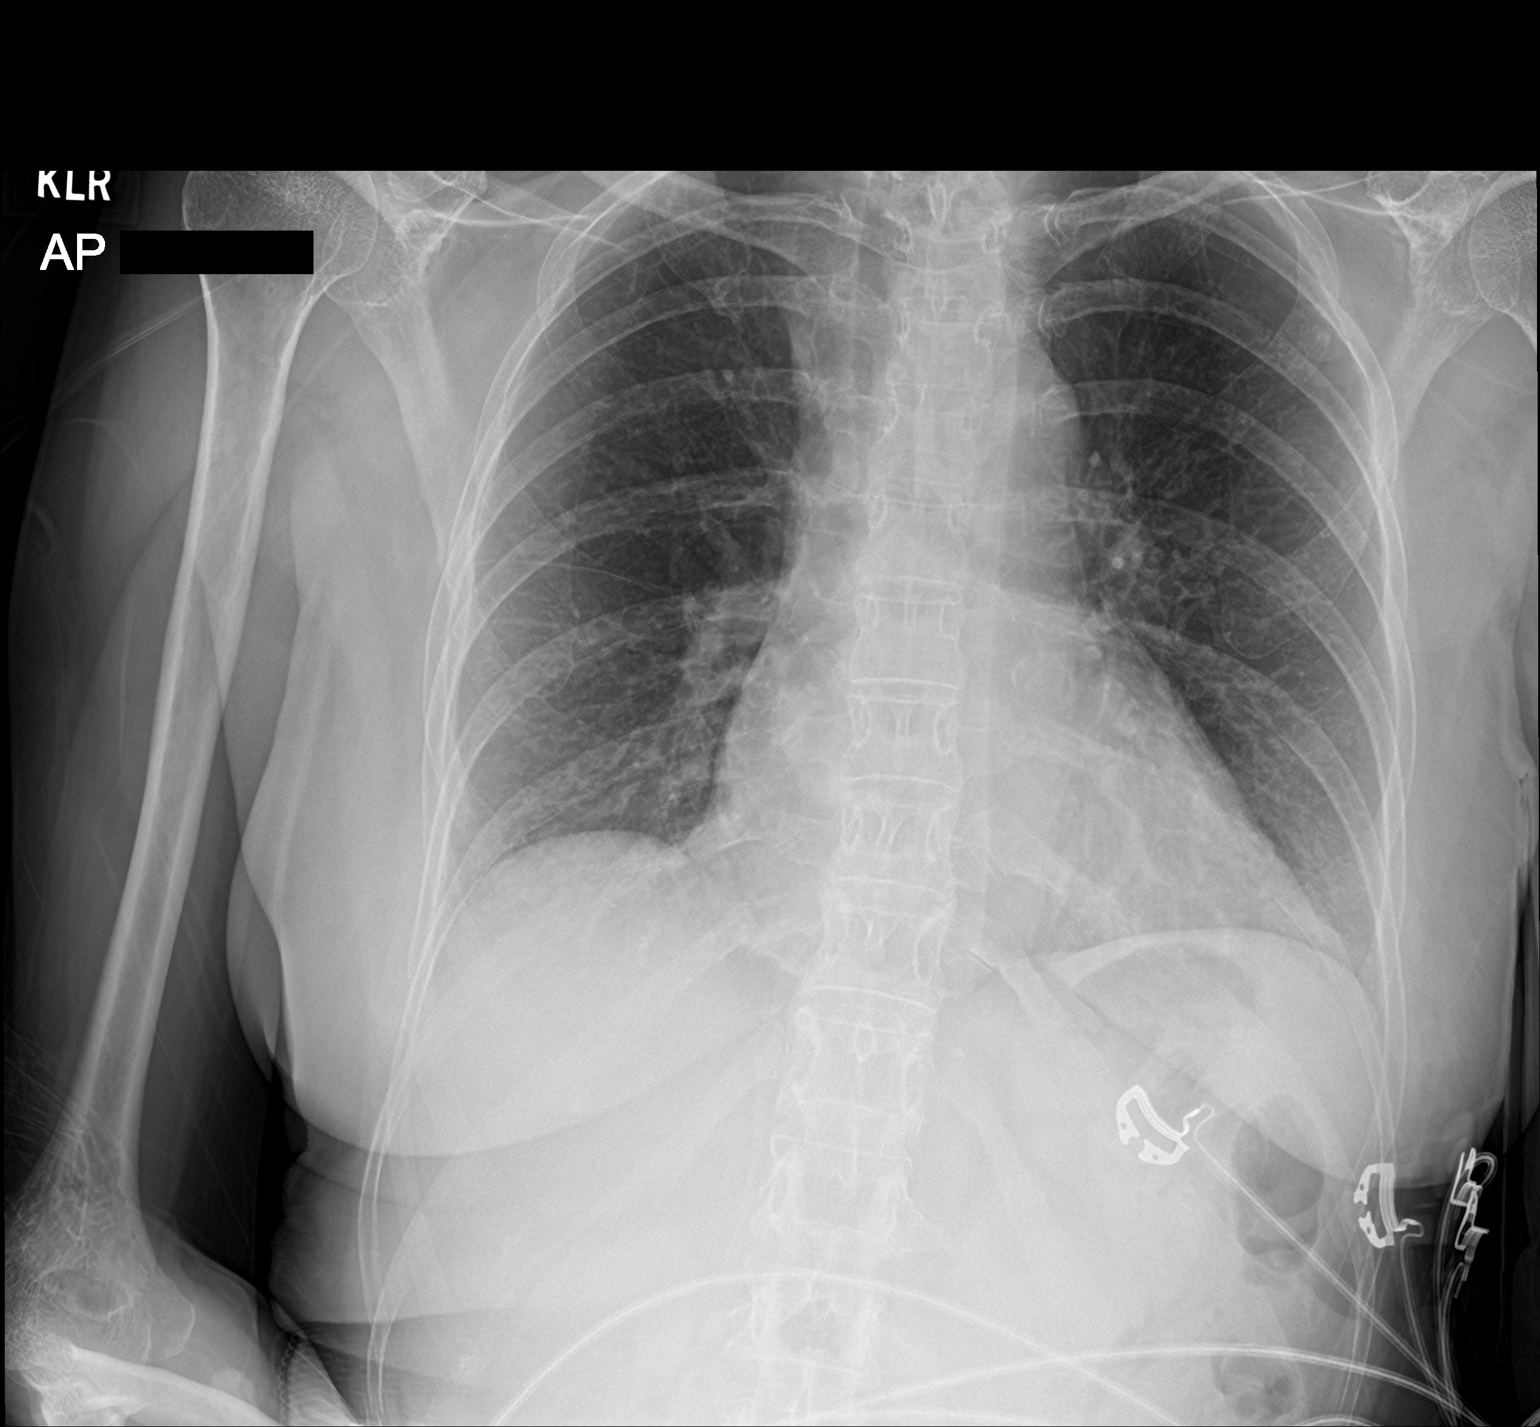

[2 of 2 positions shown; findings below may reference images not displayed]

FINDINGS: The lungs are adequately inflated and clear. The heart and pulmonary
vascularity are normal. The mediastinum is normal in width. There is
no pleural effusion. The bony thorax is unremarkable.
IMPRESSION: There is no active cardiopulmonary disease.

## 2018-07-04 IMAGING — CT CT HEAD W/O CM
3 of 6 series · 14 of 47 positions shown, 17 images · non-contrast
Comparison: 12/01/2016 CT.

CLINICAL DATA: Witnessed seizure.  History of glioblastoma.

EXAM:
CT HEAD WITHOUT CONTRAST
TECHNIQUE: Contiguous axial images were obtained from the base of the skull
through the vertex without intravenous contrast.

[Series 2: head wo · axial · 0.47mm/px · z∈[-91,+29]mm · 9 of 29 slices shown, 12 images]
[im 3/29  brain]
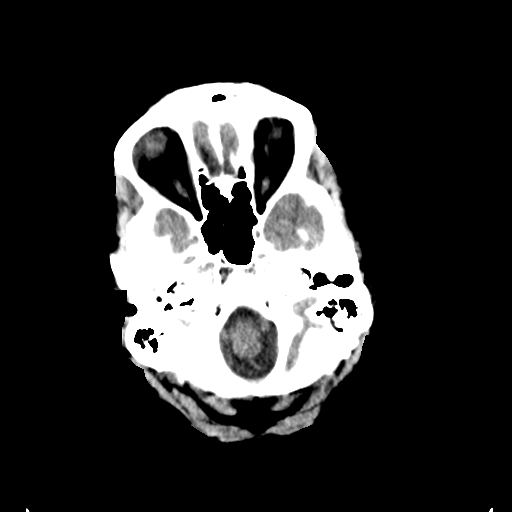
[im 3/29  bone]
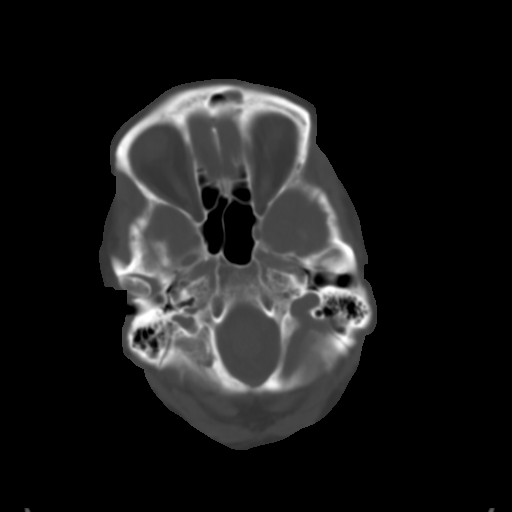
[im 7/29  brain]
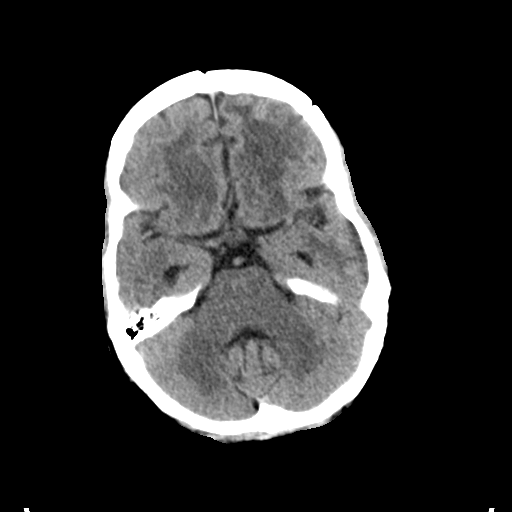
[im 9/29  brain]
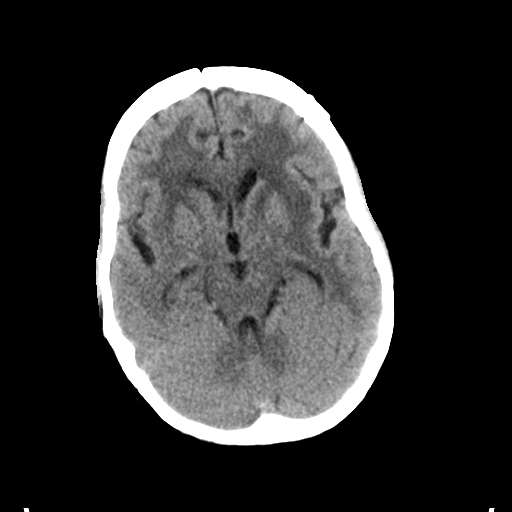
[im 13/29  brain]
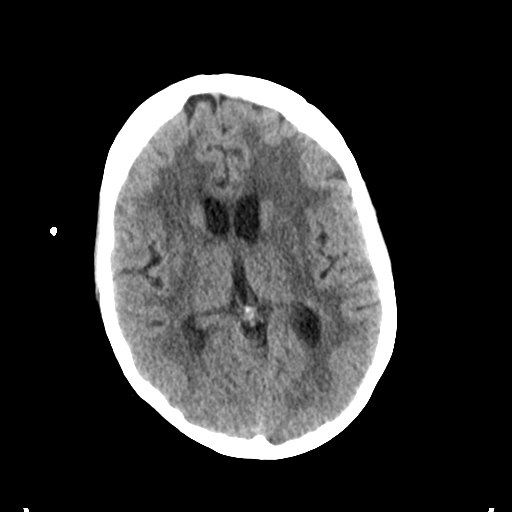
[im 15/29  brain]
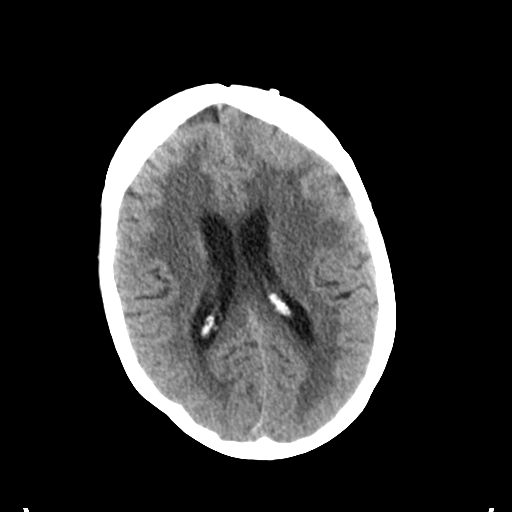
[im 15/29  bone]
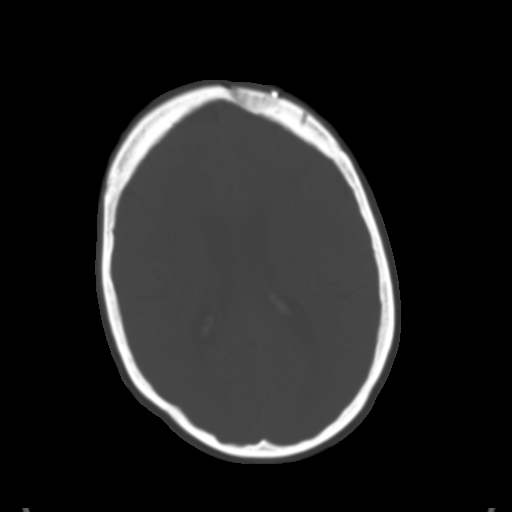
[im 17/29  brain]
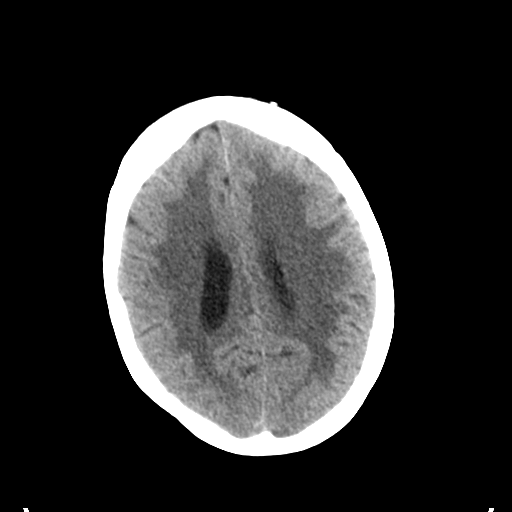
[im 21/29  brain]
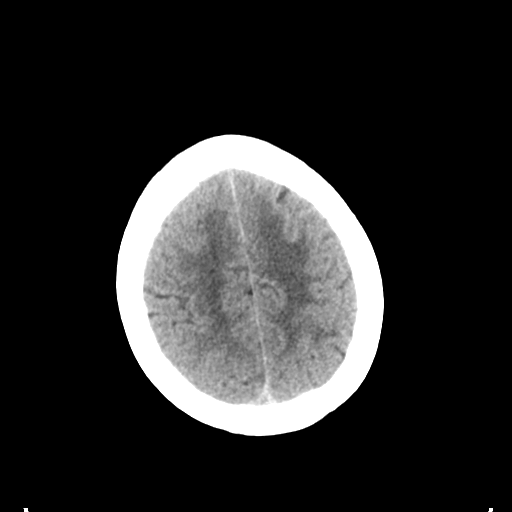
[im 23/29  brain]
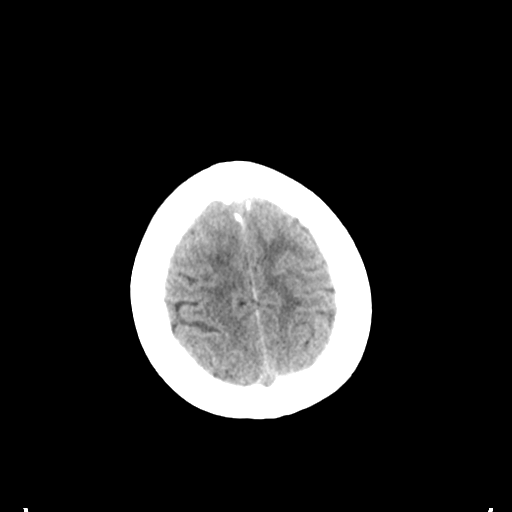
[im 27/29  brain]
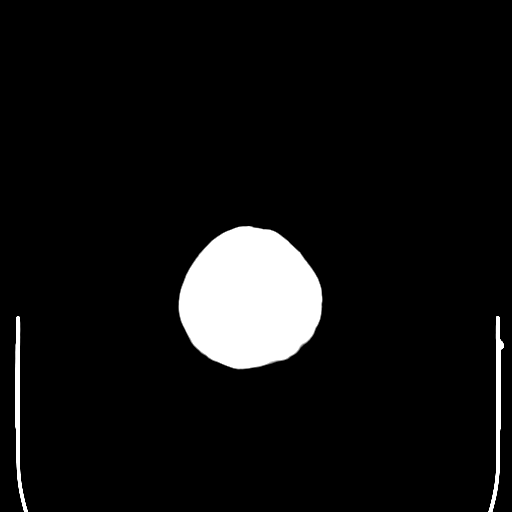
[im 27/29  bone]
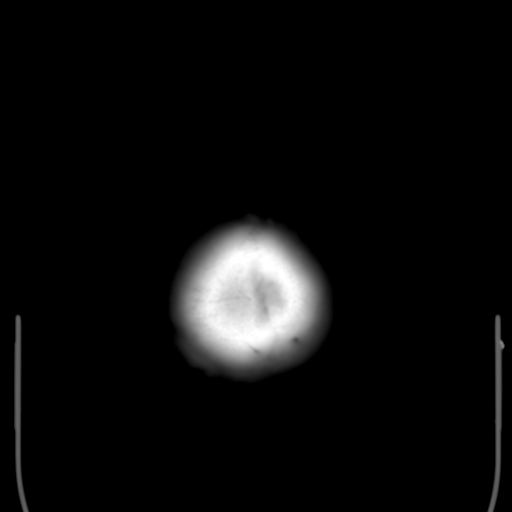

[Series 4: coronal soft tissue · coronal · 0.29mm/px · 3 of 59 slices shown]
[im 15/59  brain]
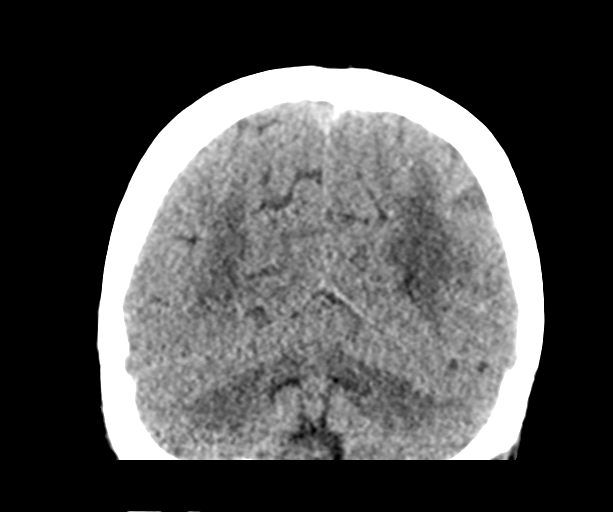
[im 30/59  brain]
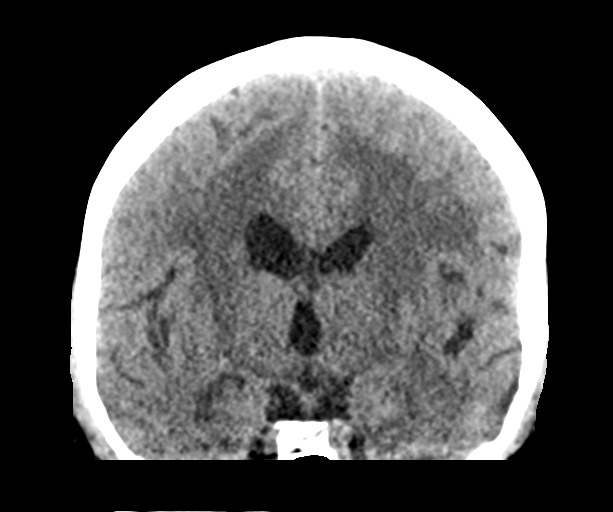
[im 44/59  brain]
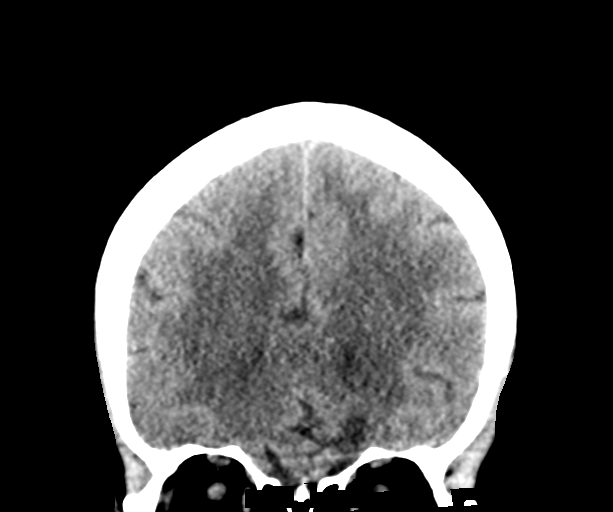

[Series 9: sagittal soft tissue · sagittal · 0.29mm/px · 2 of 46 slices shown]
[im 16/46  brain]
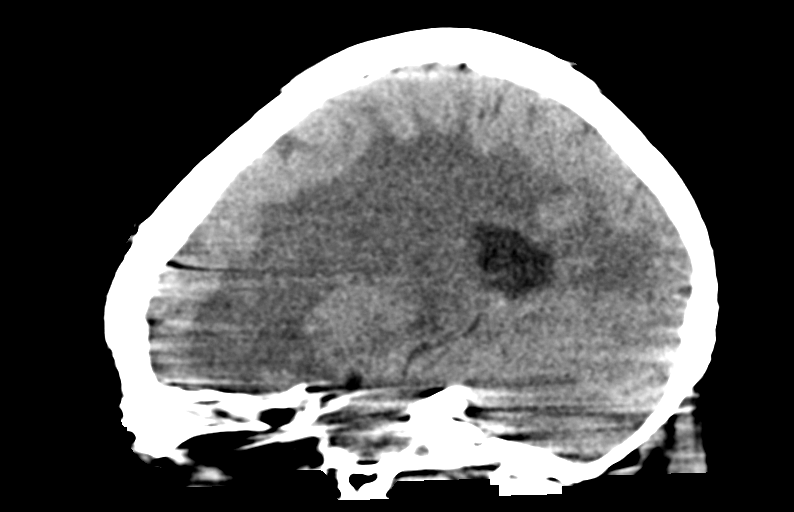
[im 31/46  brain]
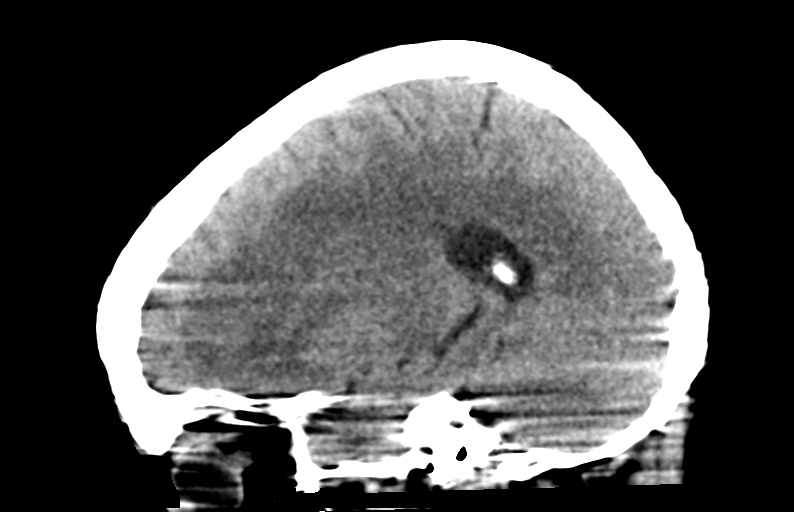

[14 of 47 positions shown; findings below may reference images not displayed]

FINDINGS: The patient was unable to remain motionless for the exam. Small or
subtle lesions could be overlooked.

Brain: No visible acute stroke, acute hemorrhage, mass lesion,
hydrocephalus, or extra-axial fluid. Normal for age cerebral volume.

Extensive hypoattenuation throughout the white matter, LEFT greater
than RIGHT, likely post treatment effect from radiation.
Encephalomalacia LEFT inferior frontal lobe, status post surgical
debulking of a large tumor, reportedly glioblastoma.

Vascular: Calcification of the cavernous internal carotid arteries
consistent with cerebrovascular atherosclerotic disease. No signs of
intracranial large vessel occlusion.

Skull: LEFT frontal craniotomy defect appears uncomplicated. There
is no skull fracture.

Sinuses/Orbits: No acute finding.

Other: None.
IMPRESSION: No acute intracranial findings. Postsurgical changes LEFT frontal
lobe.

Extensive hypoattenuation of the white matter, likely post treatment
effect.

If further investigation desired, MRI of the brain without and with
contrast is recommended. This would most optimally be performed when
the patient is stable to remain motionless.
